# Patient Record
Sex: Female | Born: 1950 | Race: White | Hispanic: No | Marital: Single | Smoking: Former smoker
Health system: Southern US, Community
[De-identification: ages and names within clinical notes are randomized; demographics above are authoritative.]

## PROBLEM LIST (undated history)

## (undated) DIAGNOSIS — E785 Hyperlipidemia, unspecified: Secondary | ICD-10-CM

## (undated) DIAGNOSIS — R112 Nausea with vomiting, unspecified: Secondary | ICD-10-CM

## (undated) DIAGNOSIS — H269 Unspecified cataract: Secondary | ICD-10-CM

## (undated) DIAGNOSIS — K219 Gastro-esophageal reflux disease without esophagitis: Secondary | ICD-10-CM

## (undated) DIAGNOSIS — I38 Endocarditis, valve unspecified: Secondary | ICD-10-CM

## (undated) DIAGNOSIS — C439 Malignant melanoma of skin, unspecified: Secondary | ICD-10-CM

## (undated) DIAGNOSIS — M1990 Unspecified osteoarthritis, unspecified site: Secondary | ICD-10-CM

## (undated) DIAGNOSIS — T4145XA Adverse effect of unspecified anesthetic, initial encounter: Secondary | ICD-10-CM

## (undated) DIAGNOSIS — T8859XA Other complications of anesthesia, initial encounter: Secondary | ICD-10-CM

## (undated) DIAGNOSIS — F99 Mental disorder, not otherwise specified: Secondary | ICD-10-CM

## (undated) DIAGNOSIS — E7800 Pure hypercholesterolemia, unspecified: Secondary | ICD-10-CM

## (undated) DIAGNOSIS — H409 Unspecified glaucoma: Secondary | ICD-10-CM

## (undated) DIAGNOSIS — Z9889 Other specified postprocedural states: Secondary | ICD-10-CM

## (undated) HISTORY — PX: TONSILLECTOMY: SUR1361

## (undated) HISTORY — PX: TUBAL LIGATION: SHX77

## (undated) HISTORY — DX: Malignant melanoma of skin, unspecified: C43.9

## (undated) HISTORY — DX: Pure hypercholesterolemia, unspecified: E78.00

## (undated) HISTORY — PX: BACK SURGERY: SHX140

## (undated) HISTORY — PX: CHOLECYSTECTOMY: SHX55

---

## 2006-03-14 ENCOUNTER — Ambulatory Visit (HOSPITAL_COMMUNITY): Admission: RE | Admit: 2006-03-14 | Discharge: 2006-03-15 | Payer: Self-pay | Admitting: Neurosurgery

## 2010-08-07 ENCOUNTER — Encounter: Payer: Self-pay | Admitting: Obstetrics and Gynecology

## 2012-09-09 ENCOUNTER — Other Ambulatory Visit: Payer: Self-pay | Admitting: Ophthalmology

## 2012-09-09 MED ORDER — TETRACAINE HCL 0.5 % OP SOLN: 1.0000 [drp] | OPHTHALMIC | Status: DC

## 2012-09-09 NOTE — H&P (Signed)
                  History & Physical:   DATE:     NAME:  Stoke, Vannesa      [id number]       HISTORY OF PRESENT ILLNESS: Chief Eye Complaints glaucoma  No new complaints today (Ou burn after using gtts)      HPI: EYES: Reports symptoms of     LOCATION:      QUALITY/COURSE:   Reports condition is   INTENSITY/SEVERITY:    Reports measurement ( or degree) as   DURATION:   Reports the general length of symptoms to be   ONSET/TIMING:   Reports occurrence as   CONTEXT/WHEN:   Reports usually associated with   MODIFIERS/TREATMENTS:  Improved by              ROS:   GEN- Constitutional: HENT: GEN - Endocrine: Reports symptoms of LUNGS/Respiratory:  HEART/Cardiovascular: Reports symptoms of ABD/Gastrointestinal:   Musculoskeletal (BJE): NEURO/Neurological: PSYCH/Psychiatric:    Is the pt oriented to time, place, person? 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 We discussed surgery as a potential option if the maximum medications and laser treatment do not continue to control the patient's intraocular pressure.  We discussed the advanced nature of her glaucoma right eye and the need for a lower intraocular pressure.  Since we have not been able to keep the pressure low with medications and with laser treatment recommend glaucoma surgery right eye .   Nuclear sclerosis   ICD#366.16  SURGERIES: SLT Nasal  OD SEC  SLT TEMPORAL 05-02-12 OD     Pick List - Surgeries    Back Surgery  MEDICATIONS: Combigan: Strength-  SIG-   OU BID 8:00am  Latanoprost Ophthalmic: Strength-  SIG-  Dose-   Freq-    Ou qhs  Trusopt: 2% (solution) SIG-  1 gtt in each eye 3 times a day for 30 days  at lunch today noon  REVIEW OF SYSTEMS: not found  TOBACCO: No exposure to tobacco.      Never smoker   ICD#V13.89  Tobacco use:     Tobacco cessation:  SOCIAL HISTORY: Starter Pick List - Social History   Unemployed(laid Off) Single  FAMILY HISTORY: Positive family history for  -   Negative family history for  -   PARENTS: CHILDREN: GRANDPARENTS: SIBLINGS: UNCLES/AUNTS: OTHERS/DISTANT:  ALLERGIES: Drug Allergies.  No Known.   Drug Allergies:     No known.    Starter - Allergies - Summary:  PHYSICAL EXAMINATION: Va: 08/06/2012 10:29  older pair of glasses     OD:cc 20/25-3 PH 20/NI OS:cc 20/25 PH 20/NI  EYEGLASSES: 03-27-2012 new glasses rx @[facility] in [place] (may need adjusting) OD: +0.25 -2.25 x 090                                              OS:+1.50 -1.50 x 080 ADD:+2.50  MR   OD:   OS: ADD:  VF:   OD:                                                OS:  PUPILS: 4mm normal  EYELIDS & OCULAR ADNEXA: normal  SLE: Conjunctiva:quiet  Cornea: arcus OU  AC:  deep and quiet  OU  Iris: gray/green  Lens:  +2  nuclear  sclerosis  OU  Vitreous:  CCT:  IOP: By Dr. Yen Le 18 mmHG/14 mmHg @1:36 pm 03-27-2012  Ta   in mmHg:Target<18 OD (last visit OD 21/OS20) OD:  21 OS:19 Time: 09/03/2012 17:15  Gonio:   Dilation:  Fundus:  optic nerve: OD: inferior notch to rim pale  OS: temp pale 75% cup                  Macula:       OD:   normal                        OS: normal  Vessels: normal  Periphery:  Ocular.  Tomography right eye reveals a superior temporal and inferior and very thin nerve fiber layer, left eye reveals an inferior nerve fiber layer otherwise intact.  Blood pressure 130/88   pulse 76 Exam: GENERAL: Appearance: General appearance can be described as well-nourished, well-developed, and in no acute distress.    LYMPHATIC: HEAD,  EARS, NOSE AND THROAT: Ears-Nose (external) Inspection: Externally, nose and ears are normal in appearance and without scars, lesions, or nodules.      Otoscopic Exam: External auditory canals and tympanic membranes are normal.      Hearing assessment shows no problems with normal conversation.    Nose exam, internally, reveals nasal mucosa, septum and turbinates are unremarkable.    Teeth, Gingiva, and Lip Exams: No lesions or evidence of infection.      Oropharynx demonstrates oral mucosa, salivary glands, tongue, tonsils, posterior pharynx, hard-soft palates are normal.  EYES: see above  NECK: Neck tissue exam demonstrates no masses, symmetrical, and trachea is midline.      LUNGS and RESPIRATORY: Lung auscultation elicits no wheezing, rhonci, rales or rubs and with equal breath sounds.    Respiratory effort described as breathing is unlabored and chest movement is symmetrical.    HEART (Cardiovascular): Heart auscultation discovers regular rate and rhythm; no murmur, gallop or rub. Normal heart sounds.    ABDOMEN (Gastrointestinal): Mass/Tenderness Exam: Neither are present.     Liver/Spleen: No hepatomegaly or splenomegaly.   MUSCULOSKELETAL (BJE): Inspection-Palpation: No major bone, joint, tendon, or muscle changes.      NEUROLOGICAL: Alert and oriented. No major deficits of coordination or sensation.      PSYCHIATRIC: Insight and judgment appear  both to be intact and appropriate.    Mood and affect are described as normal mood and full affect.    SKIN: Skin Inspection: No rashes or lesions.  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 We  discussed surgery as a potential option   if the maximum medications and laser treatment do not continue to control the patient's intraocular pressure.  We discussed the advanced nature of her glaucoma right eye and the need for a lower intraocular pressure.  Since we have not been able to keep the pressure low with medications and with laser treatment recommend glaucoma surgery right eye .   Nuclear sclerosis   ICD#366.16  SURGICAL TREATMENT PLAN: Ocular.  Tomography now Glaucoma surgery trabeculectomy / glaucoma device with mitomycin-C right eye  Risk and benefits of surgery have been reviewed with the patient and the patient agrees to proceed with the surgical procedure.         Actions:    CPT Codes: 0 6183 glaucoma device    ___________________________ Tinisha Etzkorn, [name] Starter - Inactive Problems:    High Cholesterol  

## 2012-09-10 ENCOUNTER — Encounter (HOSPITAL_COMMUNITY): Payer: Self-pay | Admitting: *Deleted

## 2012-09-11 ENCOUNTER — Encounter (HOSPITAL_COMMUNITY)
Admission: RE | Disposition: A | Discharge status: Home / Self Care | Payer: Self-pay | Source: Ambulatory Visit | Attending: Ophthalmology

## 2012-09-11 ENCOUNTER — Ambulatory Visit (HOSPITAL_COMMUNITY)
Admission: RE | Admit: 2012-09-11 | Discharge: 2012-09-11 | Disposition: A | Discharge status: Home / Self Care | Payer: BC Managed Care – PPO | Source: Ambulatory Visit | Attending: Ophthalmology | Admitting: Ophthalmology

## 2012-09-11 ENCOUNTER — Encounter (HOSPITAL_COMMUNITY): Payer: Self-pay | Admitting: *Deleted

## 2012-09-11 ENCOUNTER — Ambulatory Visit (HOSPITAL_COMMUNITY): Payer: BC Managed Care – PPO | Admitting: *Deleted

## 2012-09-11 ENCOUNTER — Encounter (HOSPITAL_COMMUNITY): Payer: Self-pay | Admitting: Pharmacy Technician

## 2012-09-11 DIAGNOSIS — K219 Gastro-esophageal reflux disease without esophagitis: Secondary | ICD-10-CM | POA: Insufficient documentation

## 2012-09-11 DIAGNOSIS — H401490 Capsular glaucoma with pseudoexfoliation of lens, unspecified eye, stage unspecified: Secondary | ICD-10-CM | POA: Insufficient documentation

## 2012-09-11 DIAGNOSIS — H53419 Scotoma involving central area, unspecified eye: Secondary | ICD-10-CM | POA: Insufficient documentation

## 2012-09-11 DIAGNOSIS — H409 Unspecified glaucoma: Secondary | ICD-10-CM | POA: Insufficient documentation

## 2012-09-11 HISTORY — DX: Other complications of anesthesia, initial encounter: T88.59XA

## 2012-09-11 HISTORY — DX: Nausea with vomiting, unspecified: R11.2

## 2012-09-11 HISTORY — DX: Mental disorder, not otherwise specified: F99

## 2012-09-11 HISTORY — DX: Adverse effect of unspecified anesthetic, initial encounter: T41.45XA

## 2012-09-11 HISTORY — DX: Other specified postprocedural states: Z98.890

## 2012-09-11 HISTORY — DX: Hyperlipidemia, unspecified: E78.5

## 2012-09-11 HISTORY — PX: TRABECULECTOMY: SHX107

## 2012-09-11 HISTORY — DX: Unspecified glaucoma: H40.9

## 2012-09-11 HISTORY — DX: Gastro-esophageal reflux disease without esophagitis: K21.9

## 2012-09-11 HISTORY — DX: Unspecified osteoarthritis, unspecified site: M19.90

## 2012-09-11 LAB — CBC
HCT: 42.3 % (ref 36.0–46.0)
Hemoglobin: 14.6 g/dL (ref 12.0–15.0)
MCHC: 34.5 g/dL (ref 30.0–36.0)
MCV: 85.3 fL (ref 78.0–100.0)

## 2012-09-11 LAB — SURGICAL PCR SCREEN: Staphylococcus aureus: NEGATIVE

## 2012-09-11 SURGERY — TRABECULECTOMY
Anesthesia: Monitor Anesthesia Care | Site: Eye | Laterality: Right | Wound class: Clean

## 2012-09-11 SURGERY — TRABECULECTOMY
Anesthesia: Monitor Anesthesia Care | Laterality: Right

## 2012-09-11 MED ORDER — BUPIVACAINE HCL (PF) 0.75 % IJ SOLN
INTRAMUSCULAR | Status: AC
  Filled 2012-09-11: qty 10

## 2012-09-11 MED ORDER — BUPIVACAINE HCL (PF) 0.75 % IJ SOLN
INTRAMUSCULAR | Status: DC | PRN
  Administered 2012-09-11: 10 mL

## 2012-09-11 MED ORDER — LACTATED RINGERS IV SOLN
INTRAVENOUS | Status: DC | PRN
  Administered 2012-09-11: 11:00:00 via INTRAVENOUS

## 2012-09-11 MED ORDER — GATIFLOXACIN 0.5 % OP SOLN
1.0000 [drp] | OPHTHALMIC | Status: AC
  Administered 2012-09-11 (×2): 1 [drp] via OPHTHALMIC

## 2012-09-11 MED ORDER — TETRACAINE HCL 0.5 % OP SOLN
OPHTHALMIC | Status: DC | PRN
  Administered 2012-09-11: 2 [drp] via OPHTHALMIC

## 2012-09-11 MED ORDER — HYALURONIDASE HUMAN 150 UNIT/ML IJ SOLN
INTRAMUSCULAR | Status: DC | PRN
  Administered 2012-09-11: 150 [IU]

## 2012-09-11 MED ORDER — TRIAMCINOLONE ACETONIDE 40 MG/ML IJ SUSP
INTRAMUSCULAR | Status: AC
  Filled 2012-09-11: qty 5

## 2012-09-11 MED ORDER — BSS IO SOLN
INTRAOCULAR | Status: DC | PRN
  Administered 2012-09-11: 60 mL via INTRAOCULAR

## 2012-09-11 MED ORDER — TRIAMCINOLONE ACETONIDE 40 MG/ML IJ SUSP
INTRAMUSCULAR | Status: DC | PRN
  Administered 2012-09-11: .2 mL

## 2012-09-11 MED ORDER — MITOMYCIN-C INJECTION USE IN OR ONLY (0.4 MG/ML)
0.5000 mL | Freq: Once | INTRAVENOUS | Status: DC
  Filled 2012-09-11: qty 0.5

## 2012-09-11 MED ORDER — LIDOCAINE HCL 2 % IJ SOLN
INTRAMUSCULAR | Status: AC
  Filled 2012-09-11: qty 20

## 2012-09-11 MED ORDER — SODIUM HYALURONATE 10 MG/ML IO SOLN
INTRAOCULAR | Status: AC
  Filled 2012-09-11: qty 0.85

## 2012-09-11 MED ORDER — BSS IO SOLN
INTRAOCULAR | Status: AC
  Filled 2012-09-11: qty 500

## 2012-09-11 MED ORDER — FENTANYL CITRATE 0.05 MG/ML IJ SOLN
INTRAMUSCULAR | Status: DC | PRN
  Administered 2012-09-11: 25 ug via INTRAVENOUS

## 2012-09-11 MED ORDER — LIDOCAINE-EPINEPHRINE 2 %-1:100000 IJ SOLN
INTRAMUSCULAR | Status: AC
  Filled 2012-09-11: qty 1

## 2012-09-11 MED ORDER — PROVISC 10 MG/ML IO SOLN
INTRAOCULAR | Status: DC | PRN
  Administered 2012-09-11: 8.5 mg via INTRAOCULAR

## 2012-09-11 MED ORDER — ACETYLCHOLINE CHLORIDE 1:100 IO SOLR
INTRAOCULAR | Status: AC
  Filled 2012-09-11: qty 1

## 2012-09-11 MED ORDER — GATIFLOXACIN 0.5 % OP SOLN
OPHTHALMIC | Status: AC
  Administered 2012-09-11: 1 [drp] via OPHTHALMIC
  Filled 2012-09-11: qty 2.5

## 2012-09-11 MED ORDER — BSS IO SOLN
INTRAOCULAR | Status: AC
  Filled 2012-09-11: qty 15

## 2012-09-11 MED ORDER — PILOCARPINE HCL 4 % OP SOLN
OPHTHALMIC | Status: AC
  Filled 2012-09-11: qty 15

## 2012-09-11 MED ORDER — TOBRAMYCIN 0.3 % OP OINT
TOPICAL_OINTMENT | OPHTHALMIC | Status: DC | PRN
  Administered 2012-09-11: 1 via OPHTHALMIC

## 2012-09-11 MED ORDER — HYALURONIDASE HUMAN 150 UNIT/ML IJ SOLN
INTRAMUSCULAR | Status: AC
  Filled 2012-09-11: qty 1

## 2012-09-11 MED ORDER — FLUORESCEIN SODIUM 1 MG OP STRP
ORAL_STRIP | OPHTHALMIC | Status: AC
  Filled 2012-09-11: qty 2

## 2012-09-11 MED ORDER — FLUORESCEIN SODIUM 1 MG OP STRP
ORAL_STRIP | OPHTHALMIC | Status: DC | PRN
  Administered 2012-09-11: 1 via OPHTHALMIC

## 2012-09-11 MED ORDER — TETRACAINE HCL 0.5 % OP SOLN
OPHTHALMIC | Status: AC
  Filled 2012-09-11: qty 2

## 2012-09-11 MED ORDER — MITOMYCIN-C INJECTION USE IN OR ONLY (0.4 MG/ML)
INTRAVENOUS | Status: DC | PRN
  Administered 2012-09-11: 0.5 mL via OPHTHALMIC

## 2012-09-11 MED ORDER — LACTATED RINGERS IV SOLN
INTRAVENOUS | Status: DC
  Administered 2012-09-11: 10:00:00 via INTRAVENOUS

## 2012-09-11 MED ORDER — BSS IO SOLN
INTRAOCULAR | Status: DC | PRN
  Administered 2012-09-11: 15 mL via INTRAOCULAR

## 2012-09-11 MED ORDER — PROPOFOL 10 MG/ML IV BOLUS
INTRAVENOUS | Status: DC | PRN
  Administered 2012-09-11: 10 mg via INTRAVENOUS
  Administered 2012-09-11: 20 mg via INTRAVENOUS
  Administered 2012-09-11 (×4): 10 mg via INTRAVENOUS
  Administered 2012-09-11: 40 mg via INTRAVENOUS

## 2012-09-11 MED ORDER — LIDOCAINE HCL (CARDIAC) 20 MG/ML IV SOLN
INTRAVENOUS | Status: DC | PRN
  Administered 2012-09-11 (×2): 30 mg via INTRAVENOUS

## 2012-09-11 MED ORDER — ATROPINE SULFATE 1 % OP OINT
TOPICAL_OINTMENT | OPHTHALMIC | Status: AC
  Filled 2012-09-11: qty 3.5

## 2012-09-11 MED ORDER — LIDOCAINE-EPINEPHRINE 2 %-1:100000 IJ SOLN
INTRAMUSCULAR | Status: DC | PRN
  Administered 2012-09-11: 10 mL

## 2012-09-11 MED ORDER — BUPIVACAINE HCL (PF) 0.25 % IJ SOLN
INTRAMUSCULAR | Status: AC
  Filled 2012-09-11: qty 10

## 2012-09-11 MED ORDER — HEMOSTATIC AGENTS (NO CHARGE) OPTIME
TOPICAL | Status: DC | PRN
  Administered 2012-09-11: 1 via TOPICAL

## 2012-09-11 MED ORDER — TOBRAMYCIN-DEXAMETHASONE 0.3-0.1 % OP OINT
TOPICAL_OINTMENT | OPHTHALMIC | Status: AC
  Filled 2012-09-11: qty 3.5

## 2012-09-11 MED ORDER — MUPIROCIN 2 % EX OINT
TOPICAL_OINTMENT | Freq: Two times a day (BID) | CUTANEOUS | Status: DC
  Administered 2012-09-11: 1 via NASAL
  Filled 2012-09-11 (×2): qty 22

## 2012-09-11 MED ORDER — EPINEPHRINE HCL 1 MG/ML IJ SOLN
INTRAMUSCULAR | Status: AC
  Filled 2012-09-11: qty 1

## 2012-09-11 SURGICAL SUPPLY — 40 items
APPLICATOR COTTON TIP 6IN STRL (MISCELLANEOUS) ×2 IMPLANT
APPLICATOR DR MATTHEWS STRL (MISCELLANEOUS) ×2 IMPLANT
BLADE EYE CATARACT 19 1.4 BEAV (BLADE) ×2 IMPLANT
BLADE MINI RND TIP GREEN BEAV (BLADE) IMPLANT
BLADE STAB KNIFE 45DEG (BLADE) ×2 IMPLANT
CANISTER SUCTION 2500CC (MISCELLANEOUS) ×2 IMPLANT
CLOTH BEACON ORANGE TIMEOUT ST (SAFETY) ×2 IMPLANT
CORDS BIPOLAR (ELECTRODE) ×2 IMPLANT
DRAPE OPHTHALMIC 40X48 W POUCH (DRAPES) ×2 IMPLANT
DRAPE RETRACTOR (MISCELLANEOUS) ×2 IMPLANT
ERASER HMR WETFIELD 23G BP (MISCELLANEOUS) ×2 IMPLANT
GLOVE BIO SURGEON STRL SZ8 (GLOVE) ×2 IMPLANT
GLOVE BIO SURGEON STRL SZ8.5 (GLOVE) ×2 IMPLANT
GLOVE ECLIPSE 7.0 STRL STRAW (GLOVE) ×2 IMPLANT
GOWN STRL NON-REIN LRG LVL3 (GOWN DISPOSABLE) ×4 IMPLANT
KIT BASIN OR (CUSTOM PROCEDURE TRAY) ×2 IMPLANT
KIT ROOM TURNOVER OR (KITS) ×2 IMPLANT
KNIFE GRIESHABER SHARP 2.5MM (MISCELLANEOUS) ×2 IMPLANT
MARKER SKIN DUAL TIP RULER LAB (MISCELLANEOUS) ×2 IMPLANT
MASK EYE SHIELD (GAUZE/BANDAGES/DRESSINGS) ×2 IMPLANT
NEEDLE 25GX 5/8IN NON SAFETY (NEEDLE) ×2 IMPLANT
NEEDLE HYPO 30X.5 LL (NEEDLE) ×2 IMPLANT
NS IRRIG 1000ML POUR BTL (IV SOLUTION) ×2 IMPLANT
PACK CATARACT CUSTOM (CUSTOM PROCEDURE TRAY) ×2 IMPLANT
PAD ARMBOARD 7.5X6 YLW CONV (MISCELLANEOUS) ×4 IMPLANT
PAD EYE OVAL STERILE LF (GAUZE/BANDAGES/DRESSINGS) ×2 IMPLANT
SHUNT EXPRESS GLAUCOMA MINI (Shunt) ×2 IMPLANT
SPEAR EYE SURG WECK-CEL (MISCELLANEOUS) IMPLANT
SPECIMEN JAR SMALL (MISCELLANEOUS) IMPLANT
SPONGE SURGIFOAM ABS GEL 12-7 (HEMOSTASIS) ×2 IMPLANT
SUT ETHILON 10 0 CS140 6 (SUTURE) ×2 IMPLANT
SUT ETHILON 9 0 BV100 4 (SUTURE) IMPLANT
SUT SILK 6 0 G 6 (SUTURE) ×2 IMPLANT
SUT VICRYL 9-0 (SUTURE) ×2 IMPLANT
SYR 50ML SLIP (SYRINGE) ×2 IMPLANT
SYR TB 1ML LUER SLIP (SYRINGE) IMPLANT
TOWEL OR 17X24 6PK STRL BLUE (TOWEL DISPOSABLE) ×4 IMPLANT
TUBE CONNECTING 12X1/4 (SUCTIONS) ×2 IMPLANT
WATER STERILE IRR 1000ML POUR (IV SOLUTION) ×2 IMPLANT
WIPE INSTRUMENT VISIWIPE 73X73 (MISCELLANEOUS) ×2 IMPLANT

## 2012-09-11 NOTE — Anesthesia Postprocedure Evaluation (Signed)
  Anesthesia Post-op Note  Patient: Angelica Rhodes  Procedure(s) Performed: Procedure(s): TRABECULECTOMY WITH MITOMYCIN RIGHT EYE WITH GLAUCOMA DEVICE (Right) MITOMYCIN C APPLICATION (Right)  Patient Location: Short Stay  Anesthesia Type:MAC  Level of Consciousness: awake, oriented, sedated and patient cooperative  Airway and Oxygen Therapy: Patient Spontanous Breathing  Post-op Pain: none  Post-op Assessment: Post-op Vital signs reviewed, Patient's Cardiovascular Status Stable, Respiratory Function Stable, Patent Airway, No signs of Nausea or vomiting and Pain level controlled  Post-op Vital Signs: stable  Complications: No apparent anesthesia complications

## 2012-09-11 NOTE — Interval H&P Note (Signed)
History and Physical Interval Note:  09/11/2012 10:27 AM  Angelica Rhodes  has presented today for surgery, with the diagnosis of GLAUCOMA UNCONTROLLED  The various methods of treatment have been discussed with the patient and family. After consideration of risks, benefits and other options for treatment, the patient has consented to  Procedure(s): TRABECULECTOMY WITH MITOMYCIN RIGHT EYE WITH GLAUCOMA DEVICE (Right) MITOMYCIN C APPLICATION (Right) as a surgical intervention .  The patient's history has been reviewed, patient examined, no change in status, stable for surgery.  I have reviewed the patient's chart and labs.  Questions were answered to the patient's satisfaction.     Olanna Percifield

## 2012-09-11 NOTE — Preoperative (Signed)
Beta Blockers   Reason not to administer Beta Blockers:Not Applicable 

## 2012-09-11 NOTE — Transfer of Care (Signed)
Immediate Anesthesia Transfer of Care Note  Patient: Angelica Rhodes  Procedure(s) Performed: Procedure(s): TRABECULECTOMY WITH MITOMYCIN RIGHT EYE WITH GLAUCOMA DEVICE (Right) MITOMYCIN C APPLICATION (Right)  Patient Location: PACU and Short Stay  Anesthesia Type:MAC  Level of Consciousness: awake, alert , oriented and patient cooperative  Airway & Oxygen Therapy: Patient Spontanous Breathing  Post-op Assessment: Report given to PACU RN, Post -op Vital signs reviewed and stable and Patient moving all extremities X 4  Post vital signs: Reviewed and stable  Complications: No apparent anesthesia complications

## 2012-09-11 NOTE — H&P (View-Only) (Signed)
History & Physical:   DATE:     NAME:  Angelica Rhodes, Angelica Rhodes      [id number]       HISTORY OF PRESENT ILLNESS: Chief Eye Complaints glaucoma  No new complaints today (Ou burn after using gtts)      HPI: EYES: Reports symptoms of     LOCATION:      QUALITY/COURSE:   Reports condition is   INTENSITY/SEVERITY:    Reports measurement ( or degree) as   DURATION:   Reports the general length of symptoms to be   ONSET/TIMING:   Reports occurrence as   CONTEXT/WHEN:   Reports usually associated with   MODIFIERS/TREATMENTS:  Improved by              ROS:   GEN- Constitutional: HENT: GEN - Endocrine: Reports symptoms of LUNGS/Respiratory:  HEART/Cardiovascular: Reports symptoms of ABD/Gastrointestinal:   Musculoskeletal (BJE): NEURO/Neurological: PSYCH/Psychiatric:    Is the pt oriented to time, place, person? 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 We discussed surgery as a potential option if the maximum medications and laser treatment do not continue to control the patient's intraocular pressure.  We discussed the advanced nature of her glaucoma right eye and the need for a lower intraocular pressure.  Since we have not been able to keep the pressure low with medications and with laser treatment recommend glaucoma surgery right eye .   Nuclear sclerosis   ICD#366.16  SURGERIES: SLT Nasal  OD SEC  SLT TEMPORAL 05-02-12 OD     Pick List - Surgeries    Back Surgery  MEDICATIONS: Combigan: Strength-  SIG-   OU BID 8:00am  Latanoprost Ophthalmic: Strength-  SIG-  Dose-   Freq-    Ou qhs  Trusopt: 2% (solution) SIG-  1 gtt in each eye 3 times a day for 30 days  at lunch today noon  REVIEW OF SYSTEMS: not found  TOBACCO: No exposure to tobacco.      Never smoker   ICD#V13.89  Tobacco use:     Tobacco cessation:  SOCIAL HISTORY: Herbalist List - Social History   Unemployed(laid Off) Single  FAMILY HISTORY: Positive family history for  -   Negative family history for  -   PARENTS: CHILDREN: GRANDPARENTS: SIBLINGS: UNCLES/AUNTS: OTHERS/DISTANT:  ALLERGIES: Drug Allergies.  No Known.   Drug Allergies:     No known.    Starter - Allergies - Summary:  PHYSICAL EXAMINATION: Va: 08/06/2012 10:29  older pair of glasses     OD:cc 20/25-3 PH 20/NI OS:cc 20/25 PH 20/NI  EYEGLASSES: 03-27-2012 new glasses rx @[facility]  in [place] (may need adjusting) OD: +0.25 -2.25 x 090                                              OS:+1.50 -1.50 x 080 ADD:+2.50  MR   OD:  OS: ADD:  VF:   OD:                                                OS:  PUPILS: 4mm normal  EYELIDS & OCULAR ADNEXA: normal  SLE: Conjunctiva:quiet  Cornea: arcus OU  AC:  deep and quiet  OU  Iris: gray/green  Lens:  +2  nuclear  sclerosis  OU  Vitreous:  CCT:  IOP: By Dr. Despina Arias 18 mmHG/14 mmHg @1 :36 pm 03-27-2012  Ta   in mmHg:Target<18 OD (last visit OD 21/OS20) OD:  21 OS:19 Time: 09/03/2012 17:15  Gonio:   Dilation:  Fundus:  optic nerve: OD: inferior notch to rim pale  OS: temp pale 75% cup                  Macula:       OD:   normal                        OS: normal  Vessels: normal  Periphery:  Ocular.  Tomography right eye reveals a superior temporal and inferior and very thin nerve fiber layer, left eye reveals an inferior nerve fiber layer otherwise intact.  Blood pressure 130/88   pulse 76 Exam: GENERAL: Appearance: General appearance can be described as well-nourished, well-developed, and in no acute distress.    LYMPHATIC: HEAD,  EARS, NOSE AND THROAT: Ears-Nose (external) Inspection: Externally, nose and ears are normal in appearance and without scars, lesions, or nodules.      Otoscopic Exam: External auditory canals and tympanic membranes are normal.      Hearing assessment shows no problems with normal conversation.    Nose exam, internally, reveals nasal mucosa, septum and turbinates are unremarkable.    Teeth, Gingiva, and Lip Exams: No lesions or evidence of infection.      Oropharynx demonstrates oral mucosa, salivary glands, tongue, tonsils, posterior pharynx, hard-soft palates are normal.  EYES: see above  NECK: Neck tissue exam demonstrates no masses, symmetrical, and trachea is midline.      LUNGS and RESPIRATORY: Lung auscultation elicits no wheezing, rhonci, rales or rubs and with equal breath sounds.    Respiratory effort described as breathing is unlabored and chest movement is symmetrical.    HEART (Cardiovascular): Heart auscultation discovers regular rate and rhythm; no murmur, gallop or rub. Normal heart sounds.    ABDOMEN (Gastrointestinal): Mass/Tenderness Exam: Neither are present.     Liver/Spleen: No hepatomegaly or splenomegaly.   MUSCULOSKELETAL (BJE): Inspection-Palpation: No major bone, joint, tendon, or muscle changes.      NEUROLOGICAL: Alert and oriented. No major deficits of coordination or sensation.      PSYCHIATRIC: Insight and judgment appear  both to be intact and appropriate.    Mood and affect are described as normal mood and full affect.    SKIN: Skin Inspection: No rashes or lesions.  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 We  discussed surgery as a potential option  if the maximum medications and laser treatment do not continue to control the patient's intraocular pressure.  We discussed the advanced nature of her glaucoma right eye and the need for a lower intraocular pressure.  Since we have not been able to keep the pressure low with medications and with laser treatment recommend glaucoma surgery right eye .   Nuclear sclerosis   [id number]  SURGICAL TREATMENT PLAN: Ocular.  Tomography now Glaucoma surgery trabeculectomy / glaucoma device with mitomycin-C right eye  Risk and benefits of surgery have been reviewed with the patient and the patient agrees to proceed with the surgical procedure.         Actions:    CPT Codes: 0 6183 glaucoma device    ___________________________ Chalmers Guest, [name] Starter - Inactive Problems:    High Cholesterol

## 2012-09-11 NOTE — Op Note (Signed)
Preoperative diagnosis: Uncontrolled pseudoexfoliation glaucoma right eye Postoperative diagnosis: Same Procedure: Insertion of glaucoma device with mitomycin-C Anesthesia: 2% Xylocaine with epinephrine a 50-50 mixture of 0.75% Marcaine with ample [place] Assistant: Ann Procedure: The patient was taken to the operating room where she was given a peribulbar block with the aforementioned local anesthetic agent. Following this the patient's face prepped and draped in the usual sterile fashion. With the surgeon sitting superiorly and the operating microscope and positioned a Hoskins forceps was used to grasp the conjunctiva and an incision was made at the superior nasal limbus with a sharp Wescott scissors dissection was continued with the blunt Wescott scissors posteriorly the conjunctiva was recessed with a Weck-Cel sponge a Tooke blade was used to recess Tenons fibers bleeding was controlled with cautery. Following this a 45 blade was used to fashion a half thickness scleral flap with a basal 4 mm at the limbus. Decreased upper blade was used to dissect a scleral flap to the limbus following this mitomycin-C 0.4 mg per cc was placed on a Gelfoam sponge and allowed to stay under the conjunctiva and under the sclera for 2 minutes the sponge was removed and the eye was irrigated with 40 cc of balanced salt solution. Following this a paracentesis tract was formed at the 7:00 position and Provisc was injected into the anterior chamber. The scleral flap was then elevated and a 26-gauge needle attached to Provisc was passed under the scleral flap a rotated into position following this the express glaucoma filtration device was examined and noted to have no defects it was eversion P. 50 SN #[id number] the device was passed through the tract created by 26-gauge needle scleral flap was then sutured with 4 interrupted 10-0 nylon sutures. The anterior chamber remained deep throughout the case following this the conjunctiva  was then closed using a 9-0 Vicryl on a BV 100 needle. Reassess was injected in the anterior chamber to express the Provisc from the eye the bleb elevated and the incision was tested and was noted to be Seidel negative therefore subconjunctival injection of Kenalog 4 mg was given in the inferior subconjunctival space. Chalmers Guest Junior M.D.

## 2012-09-11 NOTE — Anesthesia Preprocedure Evaluation (Addendum)
Anesthesia Evaluation  Patient identified by MRN, date of birth, ID band Patient awake    Reviewed: Allergy & Precautions, H&P , NPO status , Patient's Chart, lab work & pertinent test results  History of Anesthesia Complications (+) PONV  Airway Mallampati: II TM Distance: >3 FB Neck ROM: Full    Dental  (+) Teeth Intact and Dental Advisory Given   Pulmonary neg pulmonary ROS,  breath sounds clear to auscultation        Cardiovascular negative cardio ROS  Rhythm:Regular Rate:Normal     Neuro/Psych negative neurological ROS     GI/Hepatic Neg liver ROS, GERD-  Medicated and Controlled,  Endo/Other  negative endocrine ROS  Renal/GU negative Renal ROS     Musculoskeletal   Abdominal Normal abdominal exam  (+)   Peds  Hematology negative hematology ROS (+)   Anesthesia Other Findings   Reproductive/Obstetrics                           Anesthesia Physical Anesthesia Plan  ASA: II  Anesthesia Plan: MAC   Post-op Pain Management:    Induction: Intravenous  Airway Management Planned: Mask  Additional Equipment:   Intra-op Plan:   Post-operative Plan:   Informed Consent: I have reviewed the patients History and Physical, chart, labs and discussed the procedure including the risks, benefits and alternatives for the proposed anesthesia with the patient or authorized representative who has indicated his/her understanding and acceptance.   Dental advisory given  Plan Discussed with: Anesthesiologist and Surgeon  Anesthesia Plan Comments:         Anesthesia Quick Evaluation

## 2012-09-12 ENCOUNTER — Encounter (HOSPITAL_COMMUNITY): Payer: Self-pay | Admitting: Ophthalmology

## 2013-11-20 ENCOUNTER — Encounter (HOSPITAL_COMMUNITY): Payer: Self-pay | Admitting: Pharmacy Technician

## 2013-11-24 ENCOUNTER — Other Ambulatory Visit: Payer: Self-pay | Admitting: Ophthalmology

## 2013-11-24 MED ORDER — TETRACAINE HCL 0.5 % OP SOLN: 1.0000 [drp] | OPHTHALMIC | Status: DC

## 2013-11-24 NOTE — H&P (Signed)
History & Physical:   DATE:   11-11-13  NAME:  Angelica Rhodes, Angelica Rhodes      [id number]       HISTORY OF PRESENT ILLNESS: Chief Eye Complaints   Glaucoma   patient  : Blurry vision causing  problems driving & during other daily activities, patient wants to have cataract s urgery OD   HPI: EYES: Reports symptoms of     LOCATION:   RIGHT EYE        QUALITY/COURSE:   Reports condition is worsening.        INTENSITY/SEVERITY:    Reports measurement ( or degree) as moderate.      DURATION:   Reports the general length of symptoms to be months.      ONSET/TIMING:   Reports occurrence as   CONTEXT/WHEN:   Reports usually associated with   MODIFIERS/TREATMENTS:  Improved by              ROS:   GEN- Constitutional: HENT: GEN - Endocrine: Reports symptoms of LUNGS/Respiratory:  HEART/Cardiovascular: Reports symptoms of irregular heart beats.    ABD/Gastrointestinal:   Musculoskeletal (BJE): NEURO/Neurological: PSYCH/Psychiatric:    Is the pt oriented to time, place, person? 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 Nuclear sclerosis   ICD#366.16  Onset:   Initial Date:   worse OD  SURGERIES: Trabec w/ mini tube w/[facility] OD 09/11/2012 SLT Nasal  OD SEC  SLT TEMPORAL 05-02-12 OD     Pick List - Surgeries    Back Surgery  MEDICATIONS: Combigan: 0.2%-0.5% solution SIG-  1 gtt in each affected eye every 12 hours for 30 days   Latanoprost Ophthalmic: Strength-  SIG-  Dose-  once a day (at bedtime)  OS  REVIEW OF SYSTEMS: not found  TOBACCO: No exposure to tobacco.      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  Father is dead.  ALLERGIES: Drug Allergies.  No Known.   TAPE, NON-WATERPROOF PER <=18 SQ IN.    #[id number]  Related Dxs-  Modifiers-        Starter - Allergies - Summary:  PHYSICAL EXAMINATION: VS: BMI: 27.3.  BP: 137/87.  H: 63.00 in.  P: 81 /min.  W: 154lbs 0oz.   07/29/2013 09:55  Va:  older pair of glasses     OD cc 20/CF PH 20/100 OS:cc 20/25+ PH 20/NI  EYEGLASSES: 03-27-2010 new glasses rx @[facility]  in [place] (may need adjusting) OD: plano + 1.50 x 178                                             OS:+ 0.75 + 1.00 x 004 ADD:+2.50  MR   MR:02/21/2013 10:14  OD:  -1.00 +05. x 180    20/200 OS:  + 0.50 + 1.00 x 004 ADD: +2.00  K's04/28/2015  11:21  OD:44.00,45.50 OS:44.75,45.25  VF:   OD:                                                OS:  PUPILS: 61mm normal  EYELIDS & OCULAR ADNEXA: trace right upper lid edema ptosis fissures 7 mm in primary gaze OD  Fissure 10 mm in primary gaze OS  Up gaze OD 83mm fissure OS 14 mm fissure  SLE: Conjunctiva:  nasal bleb OD - seidel 180 degrees   Cornea: arcus OU Debris in tear fillm  AC:  deep quie 8 , tube in good position OD   Iris: gray/green  Lens:  +3  nuclear  sclerosis  OD lens anterior white flecks     1-2   nuclear  sclerosis  OS Vitreous:  CCT:  IOP:   Ta    OD: 05 OS: 17 Time 11/11/2013 12:26  Gonio:   Dilation:  Fundus: good red reflex OD   optic nerve: OD: inferior notch to rim pale  OS: temp pale 75% cup                  Macula:       OD:   normal   flat                     OS: normal  Vessels: normal  Periphery: flat   Visual Fields  OD Paracentral Scotoma central scotoma visual fields stable. OS visual fields stable normal    Exam: GENERAL: Appearance: HEAD, EARS, NOSE AND THROAT: Ears-Nose (external) Inspection: Externally, nose and ears are  normal in appearance and without scars, lesions, or nodules.      Hearing assessment shows no problems with normal conversation.      LUNGS and RESPIRATORY: Lung auscultation elicits no wheezing, rhonci, rales or rubs and with equal breath sounds.    Respiratory effort described as breathing is unlabored and chest movement is symmetrical.    HEART (Cardiovascular): Heart auscultation discovers regular rate and rhythm; no murmur, gallop or rub. Normal heart sounds.    ABDOMEN (Gastrointestinal): Mass/Tenderness Exam: Neither are present.     MUSCULOSKELETAL (BJE): Inspection-Palpation: No major bone, joint, tendon, or muscle changes.      NEUROLOGICAL: Alert and oriented. No major deficits of coordination or sensation.      PSYCHIATRIC: Insight and judgment appear  both to be intact and appropriate.    Mood and affect are described as normal mood and full affect.    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 Nuclear sclerosis   ICD#366.16  Onset:   worse OD  SURGICAL TREATMENT PLAN: Start Durezol & ilevro 2 days before surgery  phaco emulsion cataract extraction  w  intraocular lens implant  OD    Risk and benefits of surgery have been reviewed with the patient and the patient agrees to proceed with the surgical procedure.  Robin called in prescription for ciloxan Actions:    Actions:     Handouts: glaucoma , what is glaucoma?, glaucoma treatment.    ___________________________ [name], [name] Starter - Inactive Problems:    High Cholesterol

## 2013-11-25 ENCOUNTER — Encounter (HOSPITAL_COMMUNITY): Payer: Self-pay | Admitting: *Deleted

## 2013-11-25 MED ORDER — CYCLOPENTOLATE HCL 1 % OP SOLN: 1.0000 [drp] | OPHTHALMIC | Status: DC

## 2013-11-25 MED ORDER — GATIFLOXACIN 0.5 % OP SOLN: 1.0000 [drp] | OPHTHALMIC | Status: DC | PRN

## 2013-11-25 MED ORDER — PHENYLEPHRINE HCL 2.5 % OP SOLN: 1.0000 [drp] | OPHTHALMIC | Status: DC

## 2013-11-25 MED ORDER — KETOROLAC TROMETHAMINE 0.5 % OP SOLN: 1.0000 [drp] | OPHTHALMIC | Status: DC

## 2013-11-25 MED ORDER — TROPICAMIDE 1 % OP SOLN: 1.0000 [drp] | OPHTHALMIC | Status: DC

## 2013-11-26 ENCOUNTER — Ambulatory Visit (HOSPITAL_COMMUNITY): Payer: Medicare Other | Admitting: Anesthesiology

## 2013-11-26 ENCOUNTER — Encounter (HOSPITAL_COMMUNITY): Payer: Medicare Other | Admitting: Anesthesiology

## 2013-11-26 ENCOUNTER — Encounter (HOSPITAL_COMMUNITY)
Admission: RE | Disposition: A | Discharge status: Home / Self Care | Payer: Self-pay | Source: Ambulatory Visit | Attending: Ophthalmology

## 2013-11-26 ENCOUNTER — Encounter (HOSPITAL_COMMUNITY): Payer: Self-pay | Admitting: *Deleted

## 2013-11-26 ENCOUNTER — Ambulatory Visit (HOSPITAL_COMMUNITY)
Admission: RE | Admit: 2013-11-26 | Discharge: 2013-11-26 | Disposition: A | Discharge status: Home / Self Care | Payer: Medicare Other | Source: Ambulatory Visit | Attending: Ophthalmology | Admitting: Ophthalmology

## 2013-11-26 DIAGNOSIS — Z87891 Personal history of nicotine dependence: Secondary | ICD-10-CM | POA: Insufficient documentation

## 2013-11-26 DIAGNOSIS — H409 Unspecified glaucoma: Secondary | ICD-10-CM | POA: Insufficient documentation

## 2013-11-26 DIAGNOSIS — K219 Gastro-esophageal reflux disease without esophagitis: Secondary | ICD-10-CM | POA: Insufficient documentation

## 2013-11-26 DIAGNOSIS — H401490 Capsular glaucoma with pseudoexfoliation of lens, unspecified eye, stage unspecified: Secondary | ICD-10-CM | POA: Insufficient documentation

## 2013-11-26 DIAGNOSIS — H2510 Age-related nuclear cataract, unspecified eye: Secondary | ICD-10-CM | POA: Insufficient documentation

## 2013-11-26 HISTORY — DX: Unspecified cataract: H26.9

## 2013-11-26 HISTORY — DX: Endocarditis, valve unspecified: I38

## 2013-11-26 HISTORY — PX: CATARACT EXTRACTION W/PHACO: SHX586

## 2013-11-26 LAB — BASIC METABOLIC PANEL
BUN: 17 mg/dL (ref 6–23)
CALCIUM: 9.6 mg/dL (ref 8.4–10.5)
CO2: 27 mEq/L (ref 19–32)
Chloride: 104 mEq/L (ref 96–112)
Creatinine, Ser: 0.68 mg/dL (ref 0.50–1.10)
GFR, EST NON AFRICAN AMERICAN: 90 mL/min — AB (ref >90)
Glucose, Bld: 108 mg/dL — ABNORMAL HIGH (ref 70–99)
POTASSIUM: 4.3 meq/L (ref 3.7–5.3)
Sodium: 143 mEq/L (ref 137–147)

## 2013-11-26 LAB — CBC
HEMATOCRIT: 39.4 % (ref 36.0–46.0)
Hemoglobin: 13.4 g/dL (ref 12.0–15.0)
MCH: 29.4 pg (ref 26.0–34.0)
MCHC: 34 g/dL (ref 30.0–36.0)
MCV: 86.4 fL (ref 78.0–100.0)
Platelets: 207 10*3/uL (ref 150–400)
RBC: 4.56 MIL/uL (ref 3.87–5.11)
RDW: 13.4 % (ref 11.5–15.5)
WBC: 5 10*3/uL (ref 4.0–10.5)

## 2013-11-26 SURGERY — PHACOEMULSIFICATION, CATARACT, WITH IOL INSERTION
Anesthesia: Monitor Anesthesia Care | Site: Eye | Laterality: Right

## 2013-11-26 MED ORDER — DEXAMETHASONE SODIUM PHOSPHATE 10 MG/ML IJ SOLN
INTRAMUSCULAR | Status: AC
  Filled 2013-11-26: qty 1

## 2013-11-26 MED ORDER — SODIUM CHLORIDE 0.9 % IV SOLN
INTRAVENOUS | Status: DC | PRN
  Administered 2013-11-26: 10:00:00 via INTRAVENOUS

## 2013-11-26 MED ORDER — PHENYLEPHRINE HCL 2.5 % OP SOLN
1.0000 [drp] | OPHTHALMIC | Status: AC
  Administered 2013-11-26 (×3): 1 [drp] via OPHTHALMIC
  Filled 2013-11-26: qty 15

## 2013-11-26 MED ORDER — TOBRAMYCIN-DEXAMETHASONE 0.3-0.1 % OP OINT
TOPICAL_OINTMENT | OPHTHALMIC | Status: AC
  Filled 2013-11-26: qty 3.5

## 2013-11-26 MED ORDER — LIDOCAINE HCL (CARDIAC) 20 MG/ML IV SOLN
INTRAVENOUS | Status: DC | PRN
  Administered 2013-11-26: 50 mg via INTRAVENOUS

## 2013-11-26 MED ORDER — ACETYLCHOLINE CHLORIDE 1:100 IO SOLR
INTRAOCULAR | Status: AC
  Filled 2013-11-26: qty 1

## 2013-11-26 MED ORDER — TROPICAMIDE 1 % OP SOLN
1.0000 [drp] | OPHTHALMIC | Status: AC
  Administered 2013-11-26 (×3): 1 [drp] via OPHTHALMIC
  Filled 2013-11-26: qty 3

## 2013-11-26 MED ORDER — CYCLOPENTOLATE HCL 1 % OP SOLN
1.0000 [drp] | OPHTHALMIC | Status: AC
  Administered 2013-11-26 (×3): 1 [drp] via OPHTHALMIC
  Filled 2013-11-26: qty 2

## 2013-11-26 MED ORDER — SODIUM CHLORIDE 0.9 % IV SOLN
INTRAVENOUS | Status: DC
  Administered 2013-11-26: 09:00:00 via INTRAVENOUS

## 2013-11-26 MED ORDER — EPINEPHRINE HCL 1 MG/ML IJ SOLN
INTRAMUSCULAR | Status: AC
  Filled 2013-11-26: qty 1

## 2013-11-26 MED ORDER — 0.9 % SODIUM CHLORIDE (POUR BTL) OPTIME
TOPICAL | Status: DC | PRN
  Administered 2013-11-26: 1000 mL

## 2013-11-26 MED ORDER — SODIUM HYALURONATE 10 MG/ML IO SOLN
INTRAOCULAR | Status: AC
  Filled 2013-11-26: qty 0.85

## 2013-11-26 MED ORDER — LIDOCAINE-EPINEPHRINE 2 %-1:100000 IJ SOLN
INTRAMUSCULAR | Status: DC | PRN
  Administered 2013-11-26: 11:00:00 via RETROBULBAR

## 2013-11-26 MED ORDER — FENTANYL CITRATE 0.05 MG/ML IJ SOLN
INTRAMUSCULAR | Status: DC | PRN
  Administered 2013-11-26: 50 ug via INTRAVENOUS

## 2013-11-26 MED ORDER — BSS IO SOLN
INTRAOCULAR | Status: DC | PRN
  Administered 2013-11-26: 10:00:00

## 2013-11-26 MED ORDER — LIDOCAINE-EPINEPHRINE 2 %-1:100000 IJ SOLN
INTRAMUSCULAR | Status: AC
  Filled 2013-11-26: qty 1

## 2013-11-26 MED ORDER — LIDOCAINE HCL 2 % IJ SOLN
INTRAMUSCULAR | Status: AC
  Filled 2013-11-26: qty 20

## 2013-11-26 MED ORDER — GENTAMICIN SULFATE 40 MG/ML IJ SOLN
INTRAMUSCULAR | Status: AC
  Filled 2013-11-26: qty 2

## 2013-11-26 MED ORDER — PILOCARPINE HCL 4 % OP SOLN
OPHTHALMIC | Status: AC
  Filled 2013-11-26: qty 15

## 2013-11-26 MED ORDER — BSS IO SOLN
INTRAOCULAR | Status: AC
  Filled 2013-11-26: qty 15

## 2013-11-26 MED ORDER — HYALURONIDASE HUMAN 150 UNIT/ML IJ SOLN
INTRAMUSCULAR | Status: AC
  Filled 2013-11-26: qty 1

## 2013-11-26 MED ORDER — KETOROLAC TROMETHAMINE 0.5 % OP SOLN
1.0000 [drp] | OPHTHALMIC | Status: AC
  Administered 2013-11-26 (×3): 1 [drp] via OPHTHALMIC
  Filled 2013-11-26: qty 5

## 2013-11-26 MED ORDER — PROPOFOL 10 MG/ML IV BOLUS
INTRAVENOUS | Status: AC
  Filled 2013-11-26: qty 20

## 2013-11-26 MED ORDER — MIDAZOLAM HCL 5 MG/5ML IJ SOLN
INTRAMUSCULAR | Status: DC | PRN
  Administered 2013-11-26 (×2): 1 mg via INTRAVENOUS

## 2013-11-26 MED ORDER — BUPIVACAINE HCL (PF) 0.75 % IJ SOLN
INTRAMUSCULAR | Status: AC
  Filled 2013-11-26: qty 10

## 2013-11-26 MED ORDER — TETRACAINE HCL 0.5 % OP SOLN
OPHTHALMIC | Status: DC | PRN
  Administered 2013-11-26: 1 [drp] via OPHTHALMIC

## 2013-11-26 MED ORDER — BSS IO SOLN
INTRAOCULAR | Status: AC
  Filled 2013-11-26: qty 500

## 2013-11-26 MED ORDER — ACETYLCHOLINE CHLORIDE 1:100 IO SOLR
INTRAOCULAR | Status: DC | PRN
  Administered 2013-11-26: 20 mg via INTRAOCULAR

## 2013-11-26 MED ORDER — TETRACAINE HCL 0.5 % OP SOLN
OPHTHALMIC | Status: AC
  Filled 2013-11-26: qty 2

## 2013-11-26 MED ORDER — SODIUM HYALURONATE 10 MG/ML IO SOLN
INTRAOCULAR | Status: DC | PRN
  Administered 2013-11-26: 0.85 mL via INTRAOCULAR

## 2013-11-26 MED ORDER — NA CHONDROIT SULF-NA HYALURON 40-30 MG/ML IO SOLN
INTRAOCULAR | Status: DC | PRN
  Administered 2013-11-26: 0.5 mL via INTRAOCULAR

## 2013-11-26 MED ORDER — FENTANYL CITRATE 0.05 MG/ML IJ SOLN
INTRAMUSCULAR | Status: AC
  Filled 2013-11-26: qty 5

## 2013-11-26 MED ORDER — MIDAZOLAM HCL 2 MG/2ML IJ SOLN
INTRAMUSCULAR | Status: AC
  Filled 2013-11-26: qty 2

## 2013-11-26 MED ORDER — NA CHONDROIT SULF-NA HYALURON 40-30 MG/ML IO SOLN
INTRAOCULAR | Status: AC
  Filled 2013-11-26: qty 0.5

## 2013-11-26 MED ORDER — PROPOFOL 10 MG/ML IV BOLUS
INTRAVENOUS | Status: DC | PRN
  Administered 2013-11-26: 30 mg via INTRAVENOUS

## 2013-11-26 MED ORDER — GATIFLOXACIN 0.5 % OP SOLN
1.0000 [drp] | OPHTHALMIC | Status: AC
  Administered 2013-11-26 (×3): 1 [drp] via OPHTHALMIC
  Filled 2013-11-26: qty 2.5

## 2013-11-26 MED ORDER — TOBRAMYCIN 0.3 % OP OINT
TOPICAL_OINTMENT | OPHTHALMIC | Status: DC | PRN
  Administered 2013-11-26: 1 via OPHTHALMIC

## 2013-11-26 SURGICAL SUPPLY — 45 items
APPLICATOR COTTON TIP 6IN STRL (MISCELLANEOUS) ×3 IMPLANT
APPLICATOR DR MATTHEWS STRL (MISCELLANEOUS) ×3 IMPLANT
BLADE 10 SAFETY STRL DISP (BLADE) ×3 IMPLANT
BLADE KERATOME 2.75 (BLADE) ×2 IMPLANT
BLADE KERATOME 2.75MM (BLADE) ×1
BLADE MINI RND TIP GREEN BEAV (BLADE) IMPLANT
BLADE STAB KNIFE 45DEG (BLADE) IMPLANT
CANNULA ANTERIOR CHAMBER 27GA (MISCELLANEOUS) ×3 IMPLANT
CORDS BIPOLAR (ELECTRODE) IMPLANT
COVER MAYO STAND STRL (DRAPES) ×3 IMPLANT
DRAPE OPHTHALMIC 40X48 W POUCH (DRAPES) ×3 IMPLANT
DRAPE RETRACTOR (MISCELLANEOUS) ×3 IMPLANT
FILTER BLUE MILLIPORE (MISCELLANEOUS) IMPLANT
GLOVE BIO SURGEON STRL SZ8 (GLOVE) ×3 IMPLANT
GLOVE SURG SS PI 7.0 STRL IVOR (GLOVE) ×3 IMPLANT
GLOVE SURG SS PI 8.0 STRL IVOR (GLOVE) ×3 IMPLANT
GOWN STRL REUS W/ TWL LRG LVL3 (GOWN DISPOSABLE) ×2 IMPLANT
GOWN STRL REUS W/TWL LRG LVL3 (GOWN DISPOSABLE) ×4
KIT BASIN OR (CUSTOM PROCEDURE TRAY) ×3 IMPLANT
KIT ROOM TURNOVER OR (KITS) ×3 IMPLANT
KNIFE CRESCENT 2.5 55 ANG (BLADE) IMPLANT
LENS IOL ACRSF IQ PC 22.0 (Intraocular Lens) ×1 IMPLANT
LENS IOL ACRYSOF IQ POST 22.0 (Intraocular Lens) ×3 IMPLANT
MASK EYE SHIELD (GAUZE/BANDAGES/DRESSINGS) ×3 IMPLANT
NEEDLE 18GX1X1/2 (RX/OR ONLY) (NEEDLE) ×3 IMPLANT
NEEDLE 25GX 5/8IN NON SAFETY (NEEDLE) ×3 IMPLANT
NEEDLE FILTER BLUNT 18X 1/2SAF (NEEDLE) ×2
NEEDLE FILTER BLUNT 18X1 1/2 (NEEDLE) ×1 IMPLANT
NS IRRIG 1000ML POUR BTL (IV SOLUTION) ×3 IMPLANT
PACK CATARACT CUSTOM (CUSTOM PROCEDURE TRAY) ×3 IMPLANT
PAD ARMBOARD 7.5X6 YLW CONV (MISCELLANEOUS) ×3 IMPLANT
PAK PIK CVS CATARACT (OPHTHALMIC) ×3 IMPLANT
PROBE ANTERIOR VITRECTOR (OPHTHALMIC) IMPLANT
SPEAR EYE SURG WECK-CEL (MISCELLANEOUS) IMPLANT
SUT ETHILON 10 0 CS140 6 (SUTURE) ×3 IMPLANT
SUT SILK 4 0 C 3 735G (SUTURE) IMPLANT
SUT SILK 6 0 G 6 (SUTURE) IMPLANT
SUT VICRYL 8 0 TG140 8 (SUTURE) IMPLANT
SYR 3ML LL SCALE MARK (SYRINGE) IMPLANT
SYR TB 1ML LUER SLIP (SYRINGE) ×3 IMPLANT
TIP ABS 45DEG FLARED 0.9MM (TIP) ×3 IMPLANT
TIP PHACO STRAIGHT 30DEG (OPHTHALMIC) ×3 IMPLANT
TOWEL OR 17X24 6PK STRL BLUE (TOWEL DISPOSABLE) ×6 IMPLANT
WATER STERILE IRR 1000ML POUR (IV SOLUTION) ×3 IMPLANT
WIPE INSTRUMENT VISIWIPE 73X73 (MISCELLANEOUS) ×3 IMPLANT

## 2013-11-26 NOTE — H&P (View-Only) (Signed)
History & Physical:   DATE:   11-11-13  NAME:  Angelica Rhodes, Angelica Rhodes      [id number]       HISTORY OF PRESENT ILLNESS: Chief Eye Complaints   Glaucoma   patient  : Blurry vision causing  problems driving & during other daily activities, patient wants to have cataract s urgery OD   HPI: EYES: Reports symptoms of     LOCATION:   RIGHT EYE        QUALITY/COURSE:   Reports condition is worsening.        INTENSITY/SEVERITY:    Reports measurement ( or degree) as moderate.      DURATION:   Reports the general length of symptoms to be months.      ONSET/TIMING:   Reports occurrence as   CONTEXT/WHEN:   Reports usually associated with   MODIFIERS/TREATMENTS:  Improved by              ROS:   GEN- Constitutional: HENT: GEN - Endocrine: Reports symptoms of LUNGS/Respiratory:  HEART/Cardiovascular: Reports symptoms of irregular heart beats.    ABD/Gastrointestinal:   Musculoskeletal (BJE): NEURO/Neurological: PSYCH/Psychiatric:    Is the pt oriented to time, place, person? 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 Nuclear sclerosis   ICD#366.16  Onset:   Initial Date:   worse OD  SURGERIES: Trabec w/ mini tube w/[facility] OD 09/11/2012 SLT Nasal  OD SEC  SLT TEMPORAL 05-02-12 OD     Pick List - Surgeries    Back Surgery  MEDICATIONS: Combigan: 0.2%-0.5% solution SIG-  1 gtt in each affected eye every 12 hours for 30 days   Latanoprost Ophthalmic: Strength-  SIG-  Dose-  once a day (at bedtime)  OS  REVIEW OF SYSTEMS: not found  TOBACCO: No exposure to tobacco.      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  Father is dead.  ALLERGIES: Drug Allergies.  No Known.   TAPE, NON-WATERPROOF PER <=18 SQ IN.    #[id number]  Related Dxs-  Modifiers-        Starter - Allergies - Summary:  PHYSICAL EXAMINATION: VS: BMI: 27.3.  BP: 137/87.  H: 63.00 in.  P: 81 /min.  W: 154lbs 0oz.   07/29/2013 09:55  Va:  older pair of glasses     OD cc 20/CF PH 20/100 OS:cc 20/25+ PH 20/NI  EYEGLASSES: 03-27-2010 new glasses rx @[facility]  in [place] (may need adjusting) OD: plano + 1.50 x 178                                             OS:+ 0.75 + 1.00 x 004 ADD:+2.50  MR   MR:02/21/2013 10:14  OD:  -1.00 +05. x 180    20/200 OS:  + 0.50 + 1.00 x 004 ADD: +2.00  K's04/28/2015  11:21  OD:44.00,45.50 OS:44.75,45.25  VF:   OD:                                                OS:  PUPILS: 61mm normal  EYELIDS & OCULAR ADNEXA: trace right upper lid edema ptosis fissures 7 mm in primary gaze OD  Fissure 10 mm in primary gaze OS  Up gaze OD 83mm fissure OS 14 mm fissure  SLE: Conjunctiva:  nasal bleb OD - seidel 180 degrees   Cornea: arcus OU Debris in tear fillm  AC:  deep quie 8 , tube in good position OD   Iris: gray/green  Lens:  +3  nuclear  sclerosis  OD lens anterior white flecks     1-2   nuclear  sclerosis  OS Vitreous:  CCT:  IOP:   Ta    OD: 05 OS: 17 Time 11/11/2013 12:26  Gonio:   Dilation:  Fundus: good red reflex OD   optic nerve: OD: inferior notch to rim pale  OS: temp pale 75% cup                  Macula:       OD:   normal   flat                     OS: normal  Vessels: normal  Periphery: flat   Visual Fields  OD Paracentral Scotoma central scotoma visual fields stable. OS visual fields stable normal    Exam: GENERAL: Appearance: HEAD, EARS, NOSE AND THROAT: Ears-Nose (external) Inspection: Externally, nose and ears are  normal in appearance and without scars, lesions, or nodules.      Hearing assessment shows no problems with normal conversation.      LUNGS and RESPIRATORY: Lung auscultation elicits no wheezing, rhonci, rales or rubs and with equal breath sounds.    Respiratory effort described as breathing is unlabored and chest movement is symmetrical.    HEART (Cardiovascular): Heart auscultation discovers regular rate and rhythm; no murmur, gallop or rub. Normal heart sounds.    ABDOMEN (Gastrointestinal): Mass/Tenderness Exam: Neither are present.     MUSCULOSKELETAL (BJE): Inspection-Palpation: No major bone, joint, tendon, or muscle changes.      NEUROLOGICAL: Alert and oriented. No major deficits of coordination or sensation.      PSYCHIATRIC: Insight and judgment appear  both to be intact and appropriate.    Mood and affect are described as normal mood and full affect.    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 Nuclear sclerosis   ICD#366.16  Onset:   worse OD  SURGICAL TREATMENT PLAN: Start Durezol & ilevro 2 days before surgery  phaco emulsion cataract extraction  w  intraocular lens implant  OD    Risk and benefits of surgery have been reviewed with the patient and the patient agrees to proceed with the surgical procedure.  Robin called in prescription for ciloxan Actions:    Actions:     Handouts: glaucoma , what is glaucoma?, glaucoma treatment.    ___________________________ [name], [name] Starter - Inactive Problems:    High Cholesterol

## 2013-11-26 NOTE — Discharge Instructions (Signed)
The patient may remove the eye patch today at 4:00 PM. The patient should instilled the eye drops given to the patient at the office as well as the antibiotic eyedrop prescription used preoperatively. The plastic eye shield should be placed over the eye at bedtime and during sleep. The patient should where eyeglasses or sunglasses during the daytime. Avoid rubbing the eye sleep on the left side or on the back. Patient may use pain medication if this does not relieve the pain the patient should call the office.

## 2013-11-26 NOTE — Anesthesia Preprocedure Evaluation (Addendum)
Anesthesia Evaluation  Patient identified by MRN, date of birth, ID band Patient awake    Reviewed: Allergy & Precautions, H&P , NPO status , Patient's Chart, lab work & pertinent test results  History of Anesthesia Complications Negative for: history of anesthetic complications  Airway Mallampati: II TM Distance: >3 FB Neck ROM: Full    Dental  (+) Teeth Intact, Dental Advisory Given   Pulmonary former smoker,  breath sounds clear to auscultation  Pulmonary exam normal       Cardiovascular - anginaRhythm:Regular Rate:Normal  4/15 stress: EF 74%, no ischemia or scar 4/15 ECHO: normal LVF, EF 60-65%, mild AI, trace MR   Neuro/Psych negative neurological ROS     GI/Hepatic Neg liver ROS, GERD-  Controlled,  Endo/Other  negative endocrine ROS  Renal/GU negative Renal ROS     Musculoskeletal   Abdominal   Peds  Hematology negative hematology ROS (+)   Anesthesia Other Findings   Reproductive/Obstetrics                          Anesthesia Physical Anesthesia Plan  ASA: II  Anesthesia Plan: MAC   Post-op Pain Management:    Induction: Intravenous  Airway Management Planned: Nasal Cannula  Additional Equipment:   Intra-op Plan:   Post-operative Plan:   Informed Consent: I have reviewed the patients History and Physical, chart, labs and discussed the procedure including the risks, benefits and alternatives for the proposed anesthesia with the patient or authorized representative who has indicated his/her understanding and acceptance.   Dental advisory given  Plan Discussed with: CRNA and Surgeon  Anesthesia Plan Comments: (Plan routine monitors, MAC)        Anesthesia Quick Evaluation

## 2013-11-26 NOTE — Interval H&P Note (Signed)
History and Physical Interval Note:  11/26/2013 10:09 AM  Angelica Rhodes  has presented today for surgery, with the diagnosis of Senile nuclear sclerosis [366.16]  The various methods of treatment have been discussed with the patient and family. After consideration of risks, benefits and other options for treatment, the patient has consented to  Procedure(s): PHACO EMULSION CATARACT EXTRACTION WITH INTROCULAR LENS IMPLANTS RIGHT EYE (Right) as a surgical intervention .  The patient's history has been reviewed, patient examined, no change in status, stable for surgery.  I have reviewed the patient's chart and labs.  Questions were answered to the patient's satisfaction.     [name]

## 2013-11-26 NOTE — Anesthesia Postprocedure Evaluation (Signed)
  Anesthesia Post-op Note  Patient: Angelica Rhodes  Procedure(s) Performed: Procedure(s): PHACO EMULSION CATARACT EXTRACTION WITH INTROCULAR LENS IMPLANTS RIGHT EYE (Right)  Patient Location: PACU  Anesthesia Type:MAC  Level of Consciousness: awake  Airway and Oxygen Therapy: Patient Spontanous Breathing  Post-op Pain: mild  Post-op Assessment: Post-op Vital signs reviewed  Post-op Vital Signs: Reviewed  Last Vitals:  Filed Vitals:   11/26/13 1206  BP: 125/78  Pulse:   Temp:   Resp:     Complications: No apparent anesthesia complications

## 2013-11-26 NOTE — Op Note (Signed)
Preoperative diagnosis: Visually significant cataract following previous glaucoma surgery right eye Postoperative diagnosis: Same Procedure: Phacoemulsified tissue intraocular lens implant Complications: None Anesthesia: 2% Xylocaine with epinephrine a 50-50 mixture 0.75% Marcaine with ample Wydase Procedure: The patient transferred to the operating room where she was given a peribulbar block with the aforementioned local anesthetic agent. Pressure was applied to the eye and the eye felt a bit firm therefore the Schiotz tonometer was used to measure the intraocular pressure in that there was and 60 chemosis the Schiotz reading was greater than 10 indicating a low intraocular pressure. Following this the patient's face was prepped and draped in the usual sterile fashion a lid speculum was inserted with the operating microscope position with a surgeon sitting temporally. A Weck-Cel sponge was used to fixate the globe it was noted that there was a superior nasal bleb which was avoided throughout the case. With the Weck-Cel sponge in place a 15 blade was used to enter through clear cornea at the 11:00 position and Provisc was injected to deepen the anterior chamber. Following this an additional Weck-Cel sponge was used to and a 2.75 mm keratome blade was used in a stepwise fashion through temporal clear cornea to into the eye. Following this a bent 25-gauge needle was used to incise anterior capsule and a continuous tear curvilinear capsulorrhexis was formed. The capsule forceps were used to remove the anterior capsule BSS was used to hydrodissect and hydrodelineate the nucleus the nucleus rotated in the capsular bag. Additional Provisc was injected in the eye and the phacoemulsification Kelman tip 45 Ozil tip was used to sculpt a central trough to the Kuglen hook was used to an attempt to snap the nucleus have the nucleus was too dense to snap therefore for troughs were formed with deep sculpting the phacotip  and the Kuglen hook were then used to separate each of the 4 quadrants the quadrants were then aspirated and phacoemulsified the eye. All in this the irrigation aspiration device was used to removed cortical material there was only a small amount of central cortical material with a. Cortical of tissue. The eye was very soft at this point therefore Provisc was injected in the eye the intraocular lens implant was examined and noted to have no defects the lens was an Alcon AcrySof SN 60 WF IQ lens 22 point diopter lens SN [id number] The lens had no defects therefore the lens is placed in the lens injector and injected and positioned in the capsular bag using a Kuglen hook. The irrigation aspiration device was then used to remove residual cortical material the lens was centered. Miochol was injected in the eye and the pupil was noted to come down round the eye was pressurized and a 10-0 nylon suture was placed to achieve watertight closure at the incision. All instruments removed from the eye and topical TobraDex ointment was applied to the eye a patch and Fox U. were placed and the patient returned to recovery area in stable condition [name] Junior M.D.

## 2013-11-26 NOTE — Transfer of Care (Signed)
Immediate Anesthesia Transfer of Care Note  Patient: Angelica Rhodes  Procedure(s) Performed: Procedure(s): PHACO EMULSION CATARACT EXTRACTION WITH INTROCULAR LENS IMPLANTS RIGHT EYE (Right)  Patient Location: PACU  Anesthesia Type:MAC  Level of Consciousness: awake, alert , oriented and sedated  Airway & Oxygen Therapy: Patient Spontanous Breathing  Post-op Assessment: Report given to PACU RN  Post vital signs: Reviewed and stable  Complications: No apparent anesthesia complications

## 2013-12-02 ENCOUNTER — Encounter (HOSPITAL_COMMUNITY): Payer: Self-pay | Admitting: Ophthalmology

## 2015-08-11 DIAGNOSIS — L821 Other seborrheic keratosis: Secondary | ICD-10-CM | POA: Diagnosis not present

## 2015-08-11 DIAGNOSIS — L570 Actinic keratosis: Secondary | ICD-10-CM | POA: Diagnosis not present

## 2015-08-11 DIAGNOSIS — D485 Neoplasm of uncertain behavior of skin: Secondary | ICD-10-CM | POA: Diagnosis not present

## 2015-08-16 DIAGNOSIS — Z23 Encounter for immunization: Secondary | ICD-10-CM | POA: Diagnosis not present

## 2015-09-06 DIAGNOSIS — L570 Actinic keratosis: Secondary | ICD-10-CM | POA: Diagnosis not present

## 2015-09-06 DIAGNOSIS — L219 Seborrheic dermatitis, unspecified: Secondary | ICD-10-CM | POA: Diagnosis not present

## 2015-09-09 DIAGNOSIS — H401434 Capsular glaucoma with pseudoexfoliation of lens, bilateral, indeterminate stage: Secondary | ICD-10-CM | POA: Diagnosis not present

## 2015-09-09 DIAGNOSIS — H2512 Age-related nuclear cataract, left eye: Secondary | ICD-10-CM | POA: Diagnosis not present

## 2015-12-14 DIAGNOSIS — Z1389 Encounter for screening for other disorder: Secondary | ICD-10-CM | POA: Diagnosis not present

## 2015-12-14 DIAGNOSIS — E7800 Pure hypercholesterolemia, unspecified: Secondary | ICD-10-CM | POA: Diagnosis not present

## 2015-12-14 DIAGNOSIS — Z0000 Encounter for general adult medical examination without abnormal findings: Secondary | ICD-10-CM | POA: Diagnosis not present

## 2015-12-14 DIAGNOSIS — Z7189 Other specified counseling: Secondary | ICD-10-CM | POA: Diagnosis not present

## 2015-12-14 DIAGNOSIS — Z299 Encounter for prophylactic measures, unspecified: Secondary | ICD-10-CM | POA: Diagnosis not present

## 2015-12-14 DIAGNOSIS — R5383 Other fatigue: Secondary | ICD-10-CM | POA: Diagnosis not present

## 2015-12-14 DIAGNOSIS — Z6827 Body mass index (BMI) 27.0-27.9, adult: Secondary | ICD-10-CM | POA: Diagnosis not present

## 2015-12-14 DIAGNOSIS — Z1211 Encounter for screening for malignant neoplasm of colon: Secondary | ICD-10-CM | POA: Diagnosis not present

## 2015-12-14 DIAGNOSIS — Z79899 Other long term (current) drug therapy: Secondary | ICD-10-CM | POA: Diagnosis not present

## 2016-02-22 DIAGNOSIS — E2839 Other primary ovarian failure: Secondary | ICD-10-CM | POA: Diagnosis not present

## 2016-03-10 DIAGNOSIS — H401434 Capsular glaucoma with pseudoexfoliation of lens, bilateral, indeterminate stage: Secondary | ICD-10-CM | POA: Diagnosis not present

## 2016-03-10 DIAGNOSIS — H2512 Age-related nuclear cataract, left eye: Secondary | ICD-10-CM | POA: Diagnosis not present

## 2016-05-12 DIAGNOSIS — Z1231 Encounter for screening mammogram for malignant neoplasm of breast: Secondary | ICD-10-CM | POA: Diagnosis not present

## 2016-05-17 DIAGNOSIS — Z713 Dietary counseling and surveillance: Secondary | ICD-10-CM | POA: Diagnosis not present

## 2016-05-17 DIAGNOSIS — M545 Low back pain: Secondary | ICD-10-CM | POA: Diagnosis not present

## 2016-05-17 DIAGNOSIS — Z6828 Body mass index (BMI) 28.0-28.9, adult: Secondary | ICD-10-CM | POA: Diagnosis not present

## 2016-05-17 DIAGNOSIS — K219 Gastro-esophageal reflux disease without esophagitis: Secondary | ICD-10-CM | POA: Diagnosis not present

## 2016-05-23 ENCOUNTER — Encounter (INDEPENDENT_AMBULATORY_CARE_PROVIDER_SITE_OTHER): Payer: Self-pay | Admitting: Internal Medicine

## 2016-05-24 ENCOUNTER — Encounter (INDEPENDENT_AMBULATORY_CARE_PROVIDER_SITE_OTHER): Payer: Self-pay

## 2016-05-24 ENCOUNTER — Encounter (INDEPENDENT_AMBULATORY_CARE_PROVIDER_SITE_OTHER): Payer: Self-pay | Admitting: Internal Medicine

## 2016-05-24 ENCOUNTER — Other Ambulatory Visit (INDEPENDENT_AMBULATORY_CARE_PROVIDER_SITE_OTHER): Payer: Self-pay | Admitting: Internal Medicine

## 2016-05-24 ENCOUNTER — Encounter (INDEPENDENT_AMBULATORY_CARE_PROVIDER_SITE_OTHER): Payer: Self-pay | Admitting: *Deleted

## 2016-05-24 ENCOUNTER — Ambulatory Visit (INDEPENDENT_AMBULATORY_CARE_PROVIDER_SITE_OTHER): Payer: Medicare Other | Admitting: Internal Medicine

## 2016-05-24 VITALS — BP 110/78 | HR 72 | Temp 98.1°F | Resp 18 | Ht 63.0 in | Wt 157.6 lb

## 2016-05-24 DIAGNOSIS — R1310 Dysphagia, unspecified: Secondary | ICD-10-CM

## 2016-05-24 DIAGNOSIS — E7800 Pure hypercholesterolemia, unspecified: Secondary | ICD-10-CM

## 2016-05-24 DIAGNOSIS — K219 Gastro-esophageal reflux disease without esophagitis: Secondary | ICD-10-CM | POA: Diagnosis not present

## 2016-05-24 DIAGNOSIS — R1319 Other dysphagia: Secondary | ICD-10-CM

## 2016-05-24 HISTORY — DX: Pure hypercholesterolemia, unspecified: E78.00

## 2016-05-24 HISTORY — DX: Gastro-esophageal reflux disease without esophagitis: K21.9

## 2016-05-24 NOTE — Patient Instructions (Signed)
EGD/ED. The risks and benefits such as perforation, bleeding, and infection were reviewed with the patient and is agreeable. 

## 2016-05-24 NOTE — Progress Notes (Signed)
   Subjective:    Patient ID: Angelica Rhodes, female    DOB: [birth year], 65 y.o.   MRN: [id number]  HPI Referred by Dr. Woody Seller for GERD/dysphagia. She tells me she keeps indigestion all the time. As long as she takes Nexium which helps. She has been on Nexium for about a year. If she stops the Nexium, the acid reflux is right back. She also tells me foods are lodging in her esophagus. Dysphagia for about a month.  No foods in particular will bother her. Does not have problems with liquids. She was given Protonix last week by Dr. Woody Seller which helps.  Last colonoscopy in 2008 and was normal by Dr. Anthony Sar.     Review of Systems Past Medical History:  Diagnosis Date  . Arthritis   . Cataracts, bilateral   . Complication of anesthesia   . GERD (gastroesophageal reflux disease)    occ  . Glaucoma   . Hyperlipidemia   . Leaky heart valve   . Mental disorder   . PONV (postoperative nausea and vomiting)     Past Surgical History:  Procedure Laterality Date  . BACK SURGERY     lower back  . CATARACT EXTRACTION W/PHACO Right 11/26/2013   Procedure: PHACO EMULSION CATARACT EXTRACTION WITH INTROCULAR LENS IMPLANTS RIGHT EYE;  Surgeon: [name], MD;  Location: [facility];  Service: Ophthalmology;  Laterality: Right;  . CHOLECYSTECTOMY    . TONSILLECTOMY    . TRABECULECTOMY Right 09/11/2012   Procedure: TRABECULECTOMY WITH MITOMYCIN RIGHT EYE WITH GLAUCOMA DEVICE;  Surgeon: [name], MD;  Location: [facility];  Service: Ophthalmology;  Laterality: Right;  . TUBAL LIGATION      No Known Allergies  Current Outpatient Prescriptions on File Prior to Visit  Medication Sig Dispense Refill  . brimonidine-timolol (COMBIGAN) 0.2-0.5 % ophthalmic solution Place 1 drop into the left eye every 12 (twelve) hours.     No current facility-administered medications on file prior to visit.        Objective:   Physical Exam Blood pressure 110/78, pulse 72, temperature 98.1 F (36.7 C), temperature source  Oral, resp. rate 18, height 5\' 3"  (1.6 m), weight 157 lb 9.6 oz (71.5 kg).  Alert and oriented. Skin warm and dry. Oral mucosa is moist.   . Sclera anicteric, conjunctivae is pink. Thyroid not enlarged. No cervical lymphadenopathy. Lungs clear. Heart regular rate and rhythm.  Abdomen is soft. Bowel sounds are positive. No hepatomegaly. No abdominal masses felt. No tenderness.  No edema to lower extremities.  .       Assessment & Plan:  GERD. PUD. Needs to be ruled out. Continue the Protonix. Dysphagia. Stricture needs to ruled out.  EGD/ED. The risks and benefits such as perforation, bleeding, and infection were reviewed with the patient and is agreeable.

## 2016-05-25 ENCOUNTER — Encounter (INDEPENDENT_AMBULATORY_CARE_PROVIDER_SITE_OTHER): Payer: Self-pay

## 2016-05-25 DIAGNOSIS — R1319 Other dysphagia: Secondary | ICD-10-CM | POA: Insufficient documentation

## 2016-05-25 DIAGNOSIS — R1310 Dysphagia, unspecified: Secondary | ICD-10-CM | POA: Insufficient documentation

## 2016-05-25 DIAGNOSIS — K219 Gastro-esophageal reflux disease without esophagitis: Secondary | ICD-10-CM | POA: Insufficient documentation

## 2016-06-19 DIAGNOSIS — D492 Neoplasm of unspecified behavior of bone, soft tissue, and skin: Secondary | ICD-10-CM | POA: Diagnosis not present

## 2016-06-19 DIAGNOSIS — L821 Other seborrheic keratosis: Secondary | ICD-10-CM | POA: Diagnosis not present

## 2016-06-19 DIAGNOSIS — D239 Other benign neoplasm of skin, unspecified: Secondary | ICD-10-CM | POA: Diagnosis not present

## 2016-06-22 DIAGNOSIS — Z23 Encounter for immunization: Secondary | ICD-10-CM | POA: Diagnosis not present

## 2016-07-05 DIAGNOSIS — Z299 Encounter for prophylactic measures, unspecified: Secondary | ICD-10-CM | POA: Diagnosis not present

## 2016-07-05 DIAGNOSIS — Z87891 Personal history of nicotine dependence: Secondary | ICD-10-CM | POA: Diagnosis not present

## 2016-07-05 DIAGNOSIS — Z6827 Body mass index (BMI) 27.0-27.9, adult: Secondary | ICD-10-CM | POA: Diagnosis not present

## 2016-07-05 DIAGNOSIS — J069 Acute upper respiratory infection, unspecified: Secondary | ICD-10-CM | POA: Diagnosis not present

## 2016-07-05 DIAGNOSIS — Z713 Dietary counseling and surveillance: Secondary | ICD-10-CM | POA: Diagnosis not present

## 2016-07-05 DIAGNOSIS — M545 Low back pain: Secondary | ICD-10-CM | POA: Diagnosis not present

## 2016-07-14 ENCOUNTER — Ambulatory Visit (HOSPITAL_COMMUNITY): Admission: RE | Admit: 2016-07-14 | Payer: Medicare Other | Source: Ambulatory Visit | Admitting: Internal Medicine

## 2016-07-14 ENCOUNTER — Encounter (HOSPITAL_COMMUNITY): Admission: RE | Payer: Self-pay | Source: Ambulatory Visit

## 2016-07-14 SURGERY — EGD (ESOPHAGOGASTRODUODENOSCOPY)
Anesthesia: Moderate Sedation

## 2016-09-12 DIAGNOSIS — C44612 Basal cell carcinoma of skin of right upper limb, including shoulder: Secondary | ICD-10-CM | POA: Diagnosis not present

## 2016-09-12 DIAGNOSIS — L570 Actinic keratosis: Secondary | ICD-10-CM | POA: Diagnosis not present

## 2016-09-12 DIAGNOSIS — L821 Other seborrheic keratosis: Secondary | ICD-10-CM | POA: Diagnosis not present

## 2016-09-12 DIAGNOSIS — D229 Melanocytic nevi, unspecified: Secondary | ICD-10-CM | POA: Diagnosis not present

## 2016-09-12 DIAGNOSIS — D492 Neoplasm of unspecified behavior of bone, soft tissue, and skin: Secondary | ICD-10-CM | POA: Diagnosis not present

## 2016-09-26 DIAGNOSIS — H2512 Age-related nuclear cataract, left eye: Secondary | ICD-10-CM | POA: Diagnosis not present

## 2016-09-26 DIAGNOSIS — H401434 Capsular glaucoma with pseudoexfoliation of lens, bilateral, indeterminate stage: Secondary | ICD-10-CM | POA: Diagnosis not present

## 2016-10-05 DIAGNOSIS — D492 Neoplasm of unspecified behavior of bone, soft tissue, and skin: Secondary | ICD-10-CM | POA: Diagnosis not present

## 2016-10-05 DIAGNOSIS — L570 Actinic keratosis: Secondary | ICD-10-CM | POA: Diagnosis not present

## 2016-10-05 DIAGNOSIS — C44612 Basal cell carcinoma of skin of right upper limb, including shoulder: Secondary | ICD-10-CM | POA: Diagnosis not present

## 2016-12-20 DIAGNOSIS — Z1389 Encounter for screening for other disorder: Secondary | ICD-10-CM | POA: Diagnosis not present

## 2016-12-20 DIAGNOSIS — C439 Malignant melanoma of skin, unspecified: Secondary | ICD-10-CM | POA: Diagnosis not present

## 2016-12-20 DIAGNOSIS — Z299 Encounter for prophylactic measures, unspecified: Secondary | ICD-10-CM | POA: Diagnosis not present

## 2016-12-20 DIAGNOSIS — M19042 Primary osteoarthritis, left hand: Secondary | ICD-10-CM | POA: Diagnosis not present

## 2016-12-20 DIAGNOSIS — Z1211 Encounter for screening for malignant neoplasm of colon: Secondary | ICD-10-CM | POA: Diagnosis not present

## 2016-12-20 DIAGNOSIS — E785 Hyperlipidemia, unspecified: Secondary | ICD-10-CM | POA: Diagnosis not present

## 2016-12-20 DIAGNOSIS — R5383 Other fatigue: Secondary | ICD-10-CM | POA: Diagnosis not present

## 2016-12-20 DIAGNOSIS — M19041 Primary osteoarthritis, right hand: Secondary | ICD-10-CM | POA: Diagnosis not present

## 2016-12-20 DIAGNOSIS — Z7189 Other specified counseling: Secondary | ICD-10-CM | POA: Diagnosis not present

## 2016-12-20 DIAGNOSIS — Z79899 Other long term (current) drug therapy: Secondary | ICD-10-CM | POA: Diagnosis not present

## 2016-12-20 DIAGNOSIS — Z6827 Body mass index (BMI) 27.0-27.9, adult: Secondary | ICD-10-CM | POA: Diagnosis not present

## 2016-12-20 DIAGNOSIS — Z0000 Encounter for general adult medical examination without abnormal findings: Secondary | ICD-10-CM | POA: Diagnosis not present

## 2016-12-21 ENCOUNTER — Encounter (INDEPENDENT_AMBULATORY_CARE_PROVIDER_SITE_OTHER): Payer: Self-pay | Admitting: *Deleted

## 2016-12-25 DIAGNOSIS — I350 Nonrheumatic aortic (valve) stenosis: Secondary | ICD-10-CM | POA: Diagnosis not present

## 2017-01-18 ENCOUNTER — Encounter (INDEPENDENT_AMBULATORY_CARE_PROVIDER_SITE_OTHER): Payer: Self-pay | Admitting: *Deleted

## 2017-01-18 ENCOUNTER — Other Ambulatory Visit (INDEPENDENT_AMBULATORY_CARE_PROVIDER_SITE_OTHER): Payer: Self-pay | Admitting: *Deleted

## 2017-01-18 DIAGNOSIS — Z1211 Encounter for screening for malignant neoplasm of colon: Secondary | ICD-10-CM

## 2017-01-31 DIAGNOSIS — H2512 Age-related nuclear cataract, left eye: Secondary | ICD-10-CM | POA: Diagnosis not present

## 2017-01-31 DIAGNOSIS — H401432 Capsular glaucoma with pseudoexfoliation of lens, bilateral, moderate stage: Secondary | ICD-10-CM | POA: Diagnosis not present

## 2017-02-08 DIAGNOSIS — M722 Plantar fascial fibromatosis: Secondary | ICD-10-CM | POA: Diagnosis not present

## 2017-02-08 DIAGNOSIS — M7742 Metatarsalgia, left foot: Secondary | ICD-10-CM | POA: Diagnosis not present

## 2017-03-13 DIAGNOSIS — Z01419 Encounter for gynecological examination (general) (routine) without abnormal findings: Secondary | ICD-10-CM | POA: Diagnosis not present

## 2017-03-13 DIAGNOSIS — Z124 Encounter for screening for malignant neoplasm of cervix: Secondary | ICD-10-CM | POA: Diagnosis not present

## 2017-03-20 DIAGNOSIS — R945 Abnormal results of liver function studies: Secondary | ICD-10-CM | POA: Diagnosis not present

## 2017-04-09 DIAGNOSIS — Z299 Encounter for prophylactic measures, unspecified: Secondary | ICD-10-CM | POA: Diagnosis not present

## 2017-04-09 DIAGNOSIS — K219 Gastro-esophageal reflux disease without esophagitis: Secondary | ICD-10-CM | POA: Diagnosis not present

## 2017-04-09 DIAGNOSIS — R42 Dizziness and giddiness: Secondary | ICD-10-CM | POA: Diagnosis not present

## 2017-04-09 DIAGNOSIS — Z6827 Body mass index (BMI) 27.0-27.9, adult: Secondary | ICD-10-CM | POA: Diagnosis not present

## 2017-04-09 DIAGNOSIS — M545 Low back pain: Secondary | ICD-10-CM | POA: Diagnosis not present

## 2017-04-11 DIAGNOSIS — M4802 Spinal stenosis, cervical region: Secondary | ICD-10-CM | POA: Diagnosis not present

## 2017-04-11 DIAGNOSIS — M542 Cervicalgia: Secondary | ICD-10-CM | POA: Diagnosis not present

## 2017-04-11 DIAGNOSIS — M50323 Other cervical disc degeneration at C6-C7 level: Secondary | ICD-10-CM | POA: Diagnosis not present

## 2017-04-11 DIAGNOSIS — M50322 Other cervical disc degeneration at C5-C6 level: Secondary | ICD-10-CM | POA: Diagnosis not present

## 2017-04-11 DIAGNOSIS — R42 Dizziness and giddiness: Secondary | ICD-10-CM | POA: Diagnosis not present

## 2017-04-17 ENCOUNTER — Telehealth (INDEPENDENT_AMBULATORY_CARE_PROVIDER_SITE_OTHER): Payer: Self-pay | Admitting: *Deleted

## 2017-04-17 ENCOUNTER — Encounter (INDEPENDENT_AMBULATORY_CARE_PROVIDER_SITE_OTHER): Payer: Self-pay | Admitting: *Deleted

## 2017-04-17 MED ORDER — PEG 3350-KCL-NA BICARB-NACL 420 G PO SOLR: 4000.0000 mL | Freq: Once | ORAL | 0 refills | Status: AC

## 2017-04-17 NOTE — Telephone Encounter (Signed)
Patient needs trilyte 

## 2017-05-02 ENCOUNTER — Telehealth (INDEPENDENT_AMBULATORY_CARE_PROVIDER_SITE_OTHER): Payer: Self-pay | Admitting: *Deleted

## 2017-05-02 NOTE — Telephone Encounter (Signed)
Referring MD/PCP: vyas   Procedure: tcs  Reason/Indication:  screening  Has patient had this procedure before?  Yes, 10 yrs ago  If so, when, by whom and where?    Is there a family history of colon cancer?  no  Who?  What age when diagnosed?    Is patient diabetic?   no      Does patient have prosthetic heart valve or mechanical valve?  no  Do you have a pacemaker?  no  Has patient ever had endocarditis? no  Has patient had joint replacement within last 12 months?  no  Is patient constipated or take laxatives? no  Does patient have a history of alcohol/drug use?  no  Is patient on Coumadin, Plavix and/or Aspirin? no  Medications: pantoprazole 40 mg daily, atorvastatin 10 mg daily, combigan eye drop, xalatan eye drop  Allergies: nkda  Medication Adjustment per Dr Laural Golden:   Procedure date & time: 05/31/17 at 730

## 2017-05-02 NOTE — Telephone Encounter (Signed)
agree

## 2017-05-03 DIAGNOSIS — Z23 Encounter for immunization: Secondary | ICD-10-CM | POA: Diagnosis not present

## 2017-05-31 ENCOUNTER — Encounter (HOSPITAL_COMMUNITY)
Admission: RE | Disposition: A | Discharge status: Home / Self Care | Payer: Self-pay | Source: Ambulatory Visit | Attending: Internal Medicine

## 2017-05-31 ENCOUNTER — Ambulatory Visit (HOSPITAL_COMMUNITY)
Admission: RE | Admit: 2017-05-31 | Discharge: 2017-05-31 | Disposition: A | Discharge status: Home / Self Care | Payer: Medicare Other | Source: Ambulatory Visit | Attending: Internal Medicine | Admitting: Internal Medicine

## 2017-05-31 ENCOUNTER — Encounter (HOSPITAL_COMMUNITY): Payer: Self-pay | Admitting: *Deleted

## 2017-05-31 DIAGNOSIS — K219 Gastro-esophageal reflux disease without esophagitis: Secondary | ICD-10-CM | POA: Insufficient documentation

## 2017-05-31 DIAGNOSIS — K644 Residual hemorrhoidal skin tags: Secondary | ICD-10-CM | POA: Diagnosis not present

## 2017-05-31 DIAGNOSIS — Z1211 Encounter for screening for malignant neoplasm of colon: Secondary | ICD-10-CM | POA: Insufficient documentation

## 2017-05-31 DIAGNOSIS — K5730 Diverticulosis of large intestine without perforation or abscess without bleeding: Secondary | ICD-10-CM | POA: Diagnosis not present

## 2017-05-31 DIAGNOSIS — E7800 Pure hypercholesterolemia, unspecified: Secondary | ICD-10-CM | POA: Diagnosis not present

## 2017-05-31 DIAGNOSIS — Z79899 Other long term (current) drug therapy: Secondary | ICD-10-CM | POA: Insufficient documentation

## 2017-05-31 DIAGNOSIS — Z87891 Personal history of nicotine dependence: Secondary | ICD-10-CM | POA: Insufficient documentation

## 2017-05-31 DIAGNOSIS — Q438 Other specified congenital malformations of intestine: Secondary | ICD-10-CM | POA: Diagnosis not present

## 2017-05-31 DIAGNOSIS — D125 Benign neoplasm of sigmoid colon: Secondary | ICD-10-CM | POA: Diagnosis not present

## 2017-05-31 DIAGNOSIS — M1990 Unspecified osteoarthritis, unspecified site: Secondary | ICD-10-CM | POA: Insufficient documentation

## 2017-05-31 DIAGNOSIS — H409 Unspecified glaucoma: Secondary | ICD-10-CM | POA: Diagnosis not present

## 2017-05-31 HISTORY — PX: COLONOSCOPY: SHX5424

## 2017-05-31 SURGERY — COLONOSCOPY
Anesthesia: Moderate Sedation

## 2017-05-31 MED ORDER — ATROPINE SULFATE 1 MG/ML IJ SOLN
INTRAMUSCULAR | Status: AC
  Filled 2017-05-31: qty 1

## 2017-05-31 MED ORDER — SIMETHICONE 40 MG/0.6ML PO SUSP
ORAL | Status: DC | PRN
  Administered 2017-05-31: 08:00:00

## 2017-05-31 MED ORDER — MIDAZOLAM HCL 5 MG/5ML IJ SOLN
INTRAMUSCULAR | Status: AC
  Filled 2017-05-31: qty 10

## 2017-05-31 MED ORDER — FLUMAZENIL 0.5 MG/5ML IV SOLN
INTRAVENOUS | Status: AC
  Filled 2017-05-31: qty 5

## 2017-05-31 MED ORDER — NALOXONE HCL 0.4 MG/ML IJ SOLN
INTRAMUSCULAR | Status: AC
  Filled 2017-05-31: qty 1

## 2017-05-31 MED ORDER — MEPERIDINE HCL 50 MG/ML IJ SOLN
INTRAMUSCULAR | Status: DC | PRN
  Administered 2017-05-31 (×2): 25 mg via INTRAVENOUS

## 2017-05-31 MED ORDER — MIDAZOLAM HCL 5 MG/5ML IJ SOLN
INTRAMUSCULAR | Status: DC | PRN
  Administered 2017-05-31 (×2): 1 mg via INTRAVENOUS
  Administered 2017-05-31: 2 mg via INTRAVENOUS
  Administered 2017-05-31 (×2): 1 mg via INTRAVENOUS

## 2017-05-31 MED ORDER — EPINEPHRINE PF 1 MG/10ML IJ SOSY
PREFILLED_SYRINGE | INTRAMUSCULAR | Status: AC
  Filled 2017-05-31: qty 10

## 2017-05-31 MED ORDER — SODIUM CHLORIDE 0.9 % IV SOLN
INTRAVENOUS | Status: DC
Start: 2017-05-31 — End: 2017-05-31
  Administered 2017-05-31: 07:00:00 via INTRAVENOUS

## 2017-05-31 MED ORDER — MEPERIDINE HCL 50 MG/ML IJ SOLN
INTRAMUSCULAR | Status: AC
  Filled 2017-05-31: qty 1

## 2017-05-31 NOTE — Discharge Instructions (Signed)
Colon Polyps Polyps are tissue growths inside the body. Polyps can grow in many places, including the large intestine (colon). A polyp may be a round bump or a mushroom-shaped growth. You could have one polyp or several. Most colon polyps are noncancerous (benign). However, some colon polyps can become cancerous over time. What are the causes? The exact cause of colon polyps is not known. What increases the risk? This condition is more likely to develop in people who:  Have a family history of colon cancer or colon polyps.  Are older than 56 or older than 45 if they are African American.  Have inflammatory bowel disease, such as ulcerative colitis or Crohn disease.  Are overweight.  Smoke cigarettes.  Do not get enough exercise.  Drink too much alcohol.  Eat a diet that is: ? High in fat and red meat. ? Low in fiber.  Had childhood cancer that was treated with abdominal radiation.  What are the signs or symptoms? Most polyps do not cause symptoms. If you have symptoms, they may include:  Blood coming from your rectum when having a bowel movement.  Blood in your stool.The stool may look dark red or black.  A change in bowel habits, such as constipation or diarrhea.  How is this diagnosed? This condition is diagnosed with a colonoscopy. This is a procedure that uses a lighted, flexible scope to look at the inside of your colon. How is this treated? Treatment for this condition involves removing any polyps that are found. Those polyps will then be tested for cancer. If cancer is found, your health care provider will talk to you about options for colon cancer treatment. Follow these instructions at home: Diet  Eat plenty of fiber, such as fruits, vegetables, and whole grains.  Eat foods that are high in calcium and vitamin D, such as milk, cheese, yogurt, eggs, liver, fish, and broccoli.  Limit foods high in fat, red meats, and processed meats, such as hot dogs,  sausage, bacon, and lunch meats.  Maintain a healthy weight, or lose weight if recommended by your health care provider. General instructions  Do not smoke cigarettes.  Do not drink alcohol excessively.  Keep all follow-up visits as told by your health care provider. This is important. This includes keeping regularly scheduled colonoscopies. Talk to your health care provider about when you need a colonoscopy.  Exercise every day or as told by your health care provider. Contact a health care provider if:  You have new or worsening bleeding during a bowel movement.  You have new or increased blood in your stool.  You have a change in bowel habits.  You unexpectedly lose weight. This information is not intended to replace advice given to you by your health care provider. Make sure you discuss any questions you have with your health care provider. Document Released: 03/29/2004 Document Revised: 12/09/2015 Document Reviewed: 05/24/2015 Elsevier Interactive Patient Education  2018 [employer].   Colonoscopy, Adult, Care After This sheet gives you information about how to care for yourself after your procedure. Your doctor may also give you more specific instructions. If you have problems or questions, call your doctor. Follow these instructions at home: General instructions   For the first 24 hours after the procedure: ? Do not drive or use machinery. ? Do not sign important documents. ? Do not drink alcohol. ? Do your daily activities more slowly than normal. ? Eat foods that are soft and easy to digest. ? Rest  often.  Take over-the-counter or prescription medicines only as told by your doctor.  It is up to you to get the results of your procedure. Ask your doctor, or the department performing the procedure, when your results will be ready. To help cramping and bloating:  Try walking around.  Put heat on your belly (abdomen) as told by your doctor. Use a heat source that  your doctor recommends, such as a moist heat pack or a heating pad. ? Put a towel between your skin and the heat source. ? Leave the heat on for 20-30 minutes. ? Remove the heat if your skin turns bright red. This is especially important if you cannot feel pain, heat, or cold. You can get burned. Eating and drinking  Drink enough fluid to keep your pee (urine) clear or pale yellow.  Return to your normal diet as told by your doctor. Avoid heavy or fried foods that are hard to digest.  Avoid drinking alcohol for as long as told by your doctor. Contact a doctor if:  You have blood in your poop (stool) 2-3 days after the procedure. Get help right away if:  You have more than a small amount of blood in your poop.  You see large clumps of tissue (blood clots) in your poop.  Your belly is swollen.  You feel sick to your stomach (nauseous).  You throw up (vomit).  You have a fever.  You have belly pain that gets worse, and medicine does not help your pain. This information is not intended to replace advice given to you by your health care provider. Make sure you discuss any questions you have with your health care provider. Document Released: 08/05/2010 Document Revised: 03/27/2016 Document Reviewed: 03/27/2016 Elsevier Interactive Patient Education  2017 [place] usual medications and diet as before. No driving for 24 hours. Physician will call with biopsy results.

## 2017-05-31 NOTE — Op Note (Signed)
[facility] Patient Name: Angelica Rhodes Procedure Date: 05/31/2017 7:01 AM MRN: [id number] Date of Birth: 01/31/1951 Attending MD: Hildred Laser , MD CSN: [id number] Age: 66 Admit Type: Outpatient Procedure:                Colonoscopy Indications:              Screening for colorectal malignant neoplasm Providers:                Hildred Laser, MD, Hinton Rao, RN, Zoila Shutter,                            Technologist Referring MD:             Glenda Chroman, MD Medicines:                Meperidine 50 mg IV, Midazolam 6 mg IV Complications:            No immediate complications. Estimated Blood Loss:     Estimated blood loss was minimal. Procedure:                Pre-Anesthesia Assessment:                           - Prior to the procedure, a History and Physical                            was performed, and patient medications and                            allergies were reviewed. The patient's tolerance of                            previous anesthesia was also reviewed. The risks                            and benefits of the procedure and the sedation                            options and risks were discussed with the patient.                            All questions were answered, and informed consent                            was obtained. Prior Anticoagulants: The patient has                            taken no previous anticoagulant or antiplatelet                            agents. ASA Grade Assessment: II - A patient with                            mild systemic disease. After reviewing the risks  and benefits, the patient was deemed in                            satisfactory condition to undergo the procedure.                           After obtaining informed consent, the colonoscope                            was passed under direct vision. Throughout the                            procedure, the patient's blood pressure, pulse, and                         oxygen saturations were monitored continuously. The                            EC-[id number] ([id number]) scope was introduced through                            the anus and advanced to the the cecum, identified                            by appendiceal orifice and ileocecal valve. The                            colonoscopy was technically difficult and complex                            due to a tortuous colon. Successful completion of                            the procedure was aided by increasing the dose of                            sedation medication, changing the patient's                            position, using manual pressure and withdrawing and                            reinserting the scope. The patient tolerated the                            procedure well. The quality of the bowel                            preparation was excellent. The ileocecal valve,                            appendiceal orifice, and rectum were photographed. Scope In: Scope Out: 8:11:17 AM Scope Withdrawal Time: 0 hours 6 minutes 14 seconds  Findings:      The  perianal and digital rectal examinations were normal.      A small polyp was found in the proximal sigmoid colon. The polyp was       sessile. Biopsies were taken with a cold forceps for histology.      A single small-mouthed diverticulum was found in the sigmoid colon.      External hemorrhoids were found during retroflexion. The hemorrhoids       were small. Impression:               - One small polyp in the proximal sigmoid colon.                            Biopsied.                           - Diverticulosis in the sigmoid colon.                           - External hemorrhoids. Moderate Sedation:      Moderate (conscious) sedation was administered by the endoscopy nurse       and supervised by the endoscopist. The following parameters were       monitored: oxygen saturation, heart rate, blood pressure, CO2        capnography and response to care. Total physician intraservice time was       34 minutes. Recommendation:           - Patient has a contact number available for                            emergencies. The signs and symptoms of potential                            delayed complications were discussed with the                            patient. Return to normal activities tomorrow.                            Written discharge instructions were provided to the                            patient.                           - Resume previous diet today.                           - Continue present medications.                           - Await pathology results.                           - Repeat colonoscopy date to be determined after                            pending pathology results are reviewed. Procedure Code(s):        ---  Professional ---                           [id number], Colonoscopy, flexible; with biopsy, single                            or multiple                           99152, Moderate sedation services provided by the                            same physician or other qualified health care                            professional performing the diagnostic or                            therapeutic service that the sedation supports,                            requiring the presence of an independent trained                            observer to assist in the monitoring of the                            patient's level of consciousness and physiological                            status; initial 15 minutes of intraservice time,                            patient age 49 years or older                           [phone], Moderate sedation services; each additional                            15 minutes intraservice time Diagnosis Code(s):        --- Professional ---                           Z12.11, Encounter for screening for malignant                            neoplasm of colon                            D12.5, Benign neoplasm of sigmoid colon                           K64.4, Residual hemorrhoidal skin tags                           K57.30, Diverticulosis of large intestine without  perforation or abscess without bleeding CPT copyright 2016 American Medical Association. All rights reserved. The codes documented in this report are preliminary and upon coder review may  be revised to meet current compliance requirements. Hildred Laser, MD Hildred Laser, MD 05/31/2017 8:20:29 AM This report has been signed electronically. Number of Addenda: 0

## 2017-05-31 NOTE — H&P (Signed)
Angelica Rhodes is an 66 y.o. female.   Chief Complaint: Patient is here for colonoscopy. HPI: Patient is 66 year old Caucasian female who is here for screening colonoscopy.  Last exam was normal 10 years ago.  She denies abdominal pain change in bowel habits or rectal bleeding. Family history is negative for CRC.  Past Medical History:  Diagnosis Date  . Arthritis   . Cataracts, bilateral   . Complication of anesthesia   . GERD (gastroesophageal reflux disease)    occ    05/24/2016  . Glaucoma   . High cholesterol 05/24/2016  . Hyperlipidemia   . Leaky heart valve       . PONV (postoperative nausea and vomiting)     Past Surgical History:  Procedure Laterality Date  . BACK SURGERY     lower back  . CATARACT EXTRACTION W/PHACO Right 11/26/2013   Procedure: PHACO EMULSION CATARACT EXTRACTION WITH INTROCULAR LENS IMPLANTS RIGHT EYE;  Surgeon: [name], MD;  Location: [place];  Service: Ophthalmology;  Laterality: Right;  . CHOLECYSTECTOMY    . TONSILLECTOMY    . TRABECULECTOMY Right 09/11/2012   Procedure: TRABECULECTOMY WITH MITOMYCIN RIGHT EYE WITH GLAUCOMA DEVICE;  Surgeon: [name], MD;  Location: [place];  Service: Ophthalmology;  Laterality: Right;  . TUBAL LIGATION      Family History  Problem Relation Age of Onset  . Hemochromatosis Maternal Grandmother    Social History:  reports that she has quit smoking. She quit after 25.00 years of use. she has never used smokeless tobacco. She reports that she does not drink alcohol or use drugs.  Allergies: No Known Allergies  Medications Prior to Admission  Medication Sig Dispense Refill  . atorvastatin (LIPITOR) 10 MG tablet Take 10 mg at bedtime by mouth.    . brimonidine-timolol (COMBIGAN) 0.2-0.5 % ophthalmic solution Place 1 drop into the left eye every 12 (twelve) hours.    . dorzolamide (TRUSOPT) 2 % ophthalmic solution Place 1 drop 2 (two) times daily into the left eye.     . Glucos-Chond-Hyal Ac-Ca Fructo (MOVE  FREE JOINT HEALTH ADVANCE PO) Take 2 tablets daily by mouth.    Marland Kitchen ibuprofen (ADVIL,MOTRIN) 200 MG tablet Take 200 mg every 8 (eight) hours as needed by mouth (for pain. (typically no more than once a week)).      No results found for this or any previous visit (from the past 48 hour(s)). No results found.  ROS  Blood pressure (!) 113/56, pulse 84, temperature 97.8 F (36.6 C), temperature source Oral, resp. rate 18, SpO2 97 %. Physical Exam  Constitutional: She appears well-developed and well-nourished.  HENT:  Mouth/Throat: Oropharynx is clear and moist.  Eyes: Conjunctivae are normal. No scleral icterus.  Neck: No thyromegaly present.  Cardiovascular: Normal rate, regular rhythm and normal heart sounds.  No murmur heard. Respiratory: Effort normal and breath sounds normal.  GI: Soft. She exhibits no distension and no mass. There is no tenderness.  Musculoskeletal: She exhibits no edema.  Lymphadenopathy:    She has no cervical adenopathy.  Neurological: She is alert.  Skin: Skin is warm and dry.     Assessment/Plan Average risk screening colonoscopy.  Hildred Laser, MD 05/31/2017, 7:32 AM

## 2017-06-04 ENCOUNTER — Encounter (HOSPITAL_COMMUNITY): Payer: Self-pay | Admitting: Internal Medicine

## 2017-06-14 DIAGNOSIS — H401432 Capsular glaucoma with pseudoexfoliation of lens, bilateral, moderate stage: Secondary | ICD-10-CM | POA: Diagnosis not present

## 2017-06-14 DIAGNOSIS — H43813 Vitreous degeneration, bilateral: Secondary | ICD-10-CM | POA: Diagnosis not present

## 2017-06-14 DIAGNOSIS — H2512 Age-related nuclear cataract, left eye: Secondary | ICD-10-CM | POA: Diagnosis not present

## 2017-10-17 ENCOUNTER — Ambulatory Visit (INDEPENDENT_AMBULATORY_CARE_PROVIDER_SITE_OTHER): Payer: Medicare Other | Admitting: Orthopaedic Surgery

## 2017-12-20 DIAGNOSIS — H2512 Age-related nuclear cataract, left eye: Secondary | ICD-10-CM | POA: Diagnosis not present

## 2017-12-20 DIAGNOSIS — Z961 Presence of intraocular lens: Secondary | ICD-10-CM | POA: Diagnosis not present

## 2017-12-20 DIAGNOSIS — H401432 Capsular glaucoma with pseudoexfoliation of lens, bilateral, moderate stage: Secondary | ICD-10-CM | POA: Diagnosis not present

## 2017-12-26 DIAGNOSIS — Z79899 Other long term (current) drug therapy: Secondary | ICD-10-CM | POA: Diagnosis not present

## 2017-12-26 DIAGNOSIS — Z1331 Encounter for screening for depression: Secondary | ICD-10-CM | POA: Diagnosis not present

## 2017-12-26 DIAGNOSIS — Z299 Encounter for prophylactic measures, unspecified: Secondary | ICD-10-CM | POA: Diagnosis not present

## 2017-12-26 DIAGNOSIS — Z1211 Encounter for screening for malignant neoplasm of colon: Secondary | ICD-10-CM | POA: Diagnosis not present

## 2017-12-26 DIAGNOSIS — E7800 Pure hypercholesterolemia, unspecified: Secondary | ICD-10-CM | POA: Diagnosis not present

## 2017-12-26 DIAGNOSIS — Z0000 Encounter for general adult medical examination without abnormal findings: Secondary | ICD-10-CM | POA: Diagnosis not present

## 2017-12-26 DIAGNOSIS — Z1339 Encounter for screening examination for other mental health and behavioral disorders: Secondary | ICD-10-CM | POA: Diagnosis not present

## 2017-12-26 DIAGNOSIS — Z6828 Body mass index (BMI) 28.0-28.9, adult: Secondary | ICD-10-CM | POA: Diagnosis not present

## 2017-12-26 DIAGNOSIS — R5383 Other fatigue: Secondary | ICD-10-CM | POA: Diagnosis not present

## 2017-12-26 DIAGNOSIS — Z7189 Other specified counseling: Secondary | ICD-10-CM | POA: Diagnosis not present

## 2017-12-26 DIAGNOSIS — K219 Gastro-esophageal reflux disease without esophagitis: Secondary | ICD-10-CM | POA: Diagnosis not present

## 2018-01-15 DIAGNOSIS — M79672 Pain in left foot: Secondary | ICD-10-CM | POA: Diagnosis not present

## 2018-01-15 DIAGNOSIS — M7742 Metatarsalgia, left foot: Secondary | ICD-10-CM | POA: Diagnosis not present

## 2018-03-04 DIAGNOSIS — L280 Lichen simplex chronicus: Secondary | ICD-10-CM | POA: Diagnosis not present

## 2018-03-04 DIAGNOSIS — N814 Uterovaginal prolapse, unspecified: Secondary | ICD-10-CM | POA: Diagnosis not present

## 2018-03-04 DIAGNOSIS — N816 Rectocele: Secondary | ICD-10-CM | POA: Diagnosis not present

## 2018-03-15 DIAGNOSIS — R945 Abnormal results of liver function studies: Secondary | ICD-10-CM | POA: Diagnosis not present

## 2018-03-28 DIAGNOSIS — Z1231 Encounter for screening mammogram for malignant neoplasm of breast: Secondary | ICD-10-CM | POA: Diagnosis not present

## 2018-04-25 DIAGNOSIS — Z23 Encounter for immunization: Secondary | ICD-10-CM | POA: Diagnosis not present

## 2018-04-30 DIAGNOSIS — R945 Abnormal results of liver function studies: Secondary | ICD-10-CM | POA: Diagnosis not present

## 2018-04-30 DIAGNOSIS — Z6826 Body mass index (BMI) 26.0-26.9, adult: Secondary | ICD-10-CM | POA: Diagnosis not present

## 2018-04-30 DIAGNOSIS — K219 Gastro-esophageal reflux disease without esophagitis: Secondary | ICD-10-CM | POA: Diagnosis not present

## 2018-04-30 DIAGNOSIS — Z299 Encounter for prophylactic measures, unspecified: Secondary | ICD-10-CM | POA: Diagnosis not present

## 2018-04-30 DIAGNOSIS — C439 Malignant melanoma of skin, unspecified: Secondary | ICD-10-CM | POA: Diagnosis not present

## 2018-05-06 DIAGNOSIS — N200 Calculus of kidney: Secondary | ICD-10-CM | POA: Diagnosis not present

## 2018-05-06 DIAGNOSIS — K7581 Nonalcoholic steatohepatitis (NASH): Secondary | ICD-10-CM | POA: Diagnosis not present

## 2018-06-27 DIAGNOSIS — H43811 Vitreous degeneration, right eye: Secondary | ICD-10-CM | POA: Diagnosis not present

## 2018-06-27 DIAGNOSIS — Z961 Presence of intraocular lens: Secondary | ICD-10-CM | POA: Diagnosis not present

## 2018-06-27 DIAGNOSIS — H2512 Age-related nuclear cataract, left eye: Secondary | ICD-10-CM | POA: Diagnosis not present

## 2018-06-27 DIAGNOSIS — G43B0 Ophthalmoplegic migraine, not intractable: Secondary | ICD-10-CM | POA: Diagnosis not present

## 2018-06-27 DIAGNOSIS — H401432 Capsular glaucoma with pseudoexfoliation of lens, bilateral, moderate stage: Secondary | ICD-10-CM | POA: Diagnosis not present

## 2018-07-02 DIAGNOSIS — R945 Abnormal results of liver function studies: Secondary | ICD-10-CM | POA: Diagnosis not present

## 2018-07-02 DIAGNOSIS — Z299 Encounter for prophylactic measures, unspecified: Secondary | ICD-10-CM | POA: Diagnosis not present

## 2018-07-02 DIAGNOSIS — Z6826 Body mass index (BMI) 26.0-26.9, adult: Secondary | ICD-10-CM | POA: Diagnosis not present

## 2018-07-02 DIAGNOSIS — N814 Uterovaginal prolapse, unspecified: Secondary | ICD-10-CM | POA: Diagnosis not present

## 2018-07-22 DIAGNOSIS — N952 Postmenopausal atrophic vaginitis: Secondary | ICD-10-CM | POA: Diagnosis not present

## 2018-07-22 DIAGNOSIS — Z124 Encounter for screening for malignant neoplasm of cervix: Secondary | ICD-10-CM | POA: Diagnosis not present

## 2018-07-22 DIAGNOSIS — N8111 Cystocele, midline: Secondary | ICD-10-CM | POA: Diagnosis not present

## 2018-07-22 DIAGNOSIS — Z01419 Encounter for gynecological examination (general) (routine) without abnormal findings: Secondary | ICD-10-CM | POA: Diagnosis not present

## 2018-07-22 DIAGNOSIS — N814 Uterovaginal prolapse, unspecified: Secondary | ICD-10-CM | POA: Diagnosis not present

## 2018-08-23 DIAGNOSIS — N898 Other specified noninflammatory disorders of vagina: Secondary | ICD-10-CM | POA: Diagnosis not present

## 2018-08-23 DIAGNOSIS — N814 Uterovaginal prolapse, unspecified: Secondary | ICD-10-CM | POA: Diagnosis not present

## 2019-03-12 DIAGNOSIS — H401432 Capsular glaucoma with pseudoexfoliation of lens, bilateral, moderate stage: Secondary | ICD-10-CM | POA: Diagnosis not present

## 2019-03-12 DIAGNOSIS — G453 Amaurosis fugax: Secondary | ICD-10-CM | POA: Diagnosis not present

## 2019-03-12 DIAGNOSIS — H2512 Age-related nuclear cataract, left eye: Secondary | ICD-10-CM | POA: Diagnosis not present

## 2019-03-12 DIAGNOSIS — Z961 Presence of intraocular lens: Secondary | ICD-10-CM | POA: Diagnosis not present

## 2019-03-13 DIAGNOSIS — J90 Pleural effusion, not elsewhere classified: Secondary | ICD-10-CM | POA: Diagnosis not present

## 2019-03-13 DIAGNOSIS — I82C12 Acute embolism and thrombosis of left internal jugular vein: Secondary | ICD-10-CM | POA: Diagnosis not present

## 2019-03-13 DIAGNOSIS — I700 Atherosclerosis of aorta: Secondary | ICD-10-CM | POA: Diagnosis not present

## 2019-03-13 DIAGNOSIS — R590 Localized enlarged lymph nodes: Secondary | ICD-10-CM | POA: Diagnosis not present

## 2019-03-13 DIAGNOSIS — R591 Generalized enlarged lymph nodes: Secondary | ICD-10-CM | POA: Diagnosis not present

## 2019-03-13 DIAGNOSIS — R221 Localized swelling, mass and lump, neck: Secondary | ICD-10-CM | POA: Diagnosis not present

## 2019-03-13 DIAGNOSIS — S20212A Contusion of left front wall of thorax, initial encounter: Secondary | ICD-10-CM | POA: Diagnosis not present

## 2019-03-13 DIAGNOSIS — M542 Cervicalgia: Secondary | ICD-10-CM | POA: Diagnosis not present

## 2019-03-14 ENCOUNTER — Other Ambulatory Visit: Payer: Self-pay

## 2019-03-14 ENCOUNTER — Ambulatory Visit (HOSPITAL_COMMUNITY)
Admission: RE | Admit: 2019-03-14 | Discharge: 2019-03-14 | Disposition: A | Discharge status: Home / Self Care | Payer: Medicare Other | Source: Ambulatory Visit | Attending: Internal Medicine | Admitting: Internal Medicine

## 2019-03-14 ENCOUNTER — Other Ambulatory Visit (HOSPITAL_COMMUNITY): Payer: Self-pay | Admitting: Student

## 2019-03-14 DIAGNOSIS — Z7189 Other specified counseling: Secondary | ICD-10-CM | POA: Diagnosis not present

## 2019-03-14 DIAGNOSIS — E785 Hyperlipidemia, unspecified: Secondary | ICD-10-CM | POA: Diagnosis not present

## 2019-03-14 DIAGNOSIS — R52 Pain, unspecified: Secondary | ICD-10-CM

## 2019-03-14 DIAGNOSIS — I351 Nonrheumatic aortic (valve) insufficiency: Secondary | ICD-10-CM | POA: Diagnosis not present

## 2019-03-14 DIAGNOSIS — R5383 Other fatigue: Secondary | ICD-10-CM | POA: Diagnosis not present

## 2019-03-14 DIAGNOSIS — Z1339 Encounter for screening examination for other mental health and behavioral disorders: Secondary | ICD-10-CM | POA: Diagnosis not present

## 2019-03-14 DIAGNOSIS — Z299 Encounter for prophylactic measures, unspecified: Secondary | ICD-10-CM | POA: Diagnosis not present

## 2019-03-14 DIAGNOSIS — Z1331 Encounter for screening for depression: Secondary | ICD-10-CM | POA: Diagnosis not present

## 2019-03-14 DIAGNOSIS — Z1211 Encounter for screening for malignant neoplasm of colon: Secondary | ICD-10-CM | POA: Diagnosis not present

## 2019-03-14 DIAGNOSIS — C439 Malignant melanoma of skin, unspecified: Secondary | ICD-10-CM | POA: Diagnosis not present

## 2019-03-14 DIAGNOSIS — Z0000 Encounter for general adult medical examination without abnormal findings: Secondary | ICD-10-CM | POA: Diagnosis not present

## 2019-03-14 DIAGNOSIS — Z6827 Body mass index (BMI) 27.0-27.9, adult: Secondary | ICD-10-CM | POA: Diagnosis not present

## 2019-03-14 NOTE — Progress Notes (Signed)
LUE venous duplex       has been completed. Preliminary results can be found under CV proc through chart review. June Leap, BS, RDMS, RVT    Called negative results to McK[facility] at [facility] internal med office. Will let patient leave.

## 2019-03-17 ENCOUNTER — Encounter: Payer: Self-pay | Admitting: Cardiovascular Disease

## 2019-03-17 ENCOUNTER — Other Ambulatory Visit (HOSPITAL_COMMUNITY): Payer: Self-pay | Admitting: Ophthalmology

## 2019-03-17 DIAGNOSIS — G453 Amaurosis fugax: Secondary | ICD-10-CM

## 2019-03-17 DIAGNOSIS — Z79899 Other long term (current) drug therapy: Secondary | ICD-10-CM | POA: Diagnosis not present

## 2019-03-17 DIAGNOSIS — E785 Hyperlipidemia, unspecified: Secondary | ICD-10-CM | POA: Diagnosis not present

## 2019-03-17 DIAGNOSIS — R5383 Other fatigue: Secondary | ICD-10-CM | POA: Diagnosis not present

## 2019-03-18 ENCOUNTER — Other Ambulatory Visit: Payer: Self-pay

## 2019-03-18 ENCOUNTER — Ambulatory Visit (HOSPITAL_COMMUNITY)
Admission: RE | Admit: 2019-03-18 | Discharge: 2019-03-18 | Disposition: A | Discharge status: Home / Self Care | Payer: Medicare Other | Source: Ambulatory Visit | Attending: Ophthalmology | Admitting: Ophthalmology

## 2019-03-18 DIAGNOSIS — G453 Amaurosis fugax: Secondary | ICD-10-CM | POA: Diagnosis not present

## 2019-03-18 NOTE — Progress Notes (Signed)
Carotid artery duplex completed. Refer to "CV Proc" under chart review to view preliminary results.  03/18/2019 2:36 PM Maudry Mayhew, MHA, RVT, RDCS, RDMS

## 2019-03-20 ENCOUNTER — Other Ambulatory Visit: Payer: Self-pay

## 2019-03-20 ENCOUNTER — Encounter: Payer: Self-pay | Admitting: Cardiovascular Disease

## 2019-03-20 ENCOUNTER — Ambulatory Visit (INDEPENDENT_AMBULATORY_CARE_PROVIDER_SITE_OTHER): Payer: Medicare Other | Admitting: Nurse Practitioner

## 2019-03-20 ENCOUNTER — Encounter (INDEPENDENT_AMBULATORY_CARE_PROVIDER_SITE_OTHER): Payer: Self-pay | Admitting: Nurse Practitioner

## 2019-03-20 VITALS — BP 136/84 | HR 68 | Ht 63.0 in | Wt 153.0 lb

## 2019-03-20 DIAGNOSIS — R1310 Dysphagia, unspecified: Secondary | ICD-10-CM

## 2019-03-20 DIAGNOSIS — K219 Gastro-esophageal reflux disease without esophagitis: Secondary | ICD-10-CM | POA: Diagnosis not present

## 2019-03-20 DIAGNOSIS — R1319 Other dysphagia: Secondary | ICD-10-CM

## 2019-03-20 NOTE — Patient Instructions (Signed)
1. Schedule EGD   2. Continue Protonix 40mg  one capsule once daily  3. Further follow up to be determined after EGD completed

## 2019-03-20 NOTE — Progress Notes (Signed)
Subjective:    Patient ID: Angelica Rhodes, female    DOB: [birth year], 68 y.o.   MRN: [id number]  HPI Angelica Rhodes is a 68 year old female with a past medical history of arthritis, depression, aortic valve insufficiency, hypercholesterolemia, glaucoma, melanoma left hip 2008 and GERD. Past cholecystectomy. She presents today to schedule an EGD. She developed abrupt swelling to the left side of her neck and she presented to [facility] on 03/13/2019.  A CT angiogram of the neck identified a possible left jugular thrombosis. Edema to the left lower neck and supraclavicular area with scattered abnormal lymph nodes to this area and pericarinal mediastinal lymphadenopathy concerning for possible esophageal malignancy. A chest/abd/pelvic CT showed a contusion appearance to the left anterior chest, no hematoma, a small amount of fluid along the esophagus and a trace retroperitoneal fluid along the aorta, IVC and psoas musculature, small pleural effusions and bilateral thyroid nodules. She denies having any known chest trauma. She also had an episode of visual black out to her left eye, possible due to thrombotic emboli. She saw an ophthalmologist, it is unclear what her final diagnosis was but her right and left vision is currently intact. A carotid doppler study was done 03/18/2019 which showed RCA 1-39% stenosis. LCA 1-39% stenosis. Subclavian arteries with normal flow.  She complains of having  Indigestion, "hiccup burps" while she eats which lasts for a few minutes for the past 2 years. She took Nexium a few years ago for GERD but has not taken a PPI for the past year. She was seen in our office by Deberah Castle NP 05/24/2016 for dysphagia. An EGD was ordered but the patient canceled this procedure due to having flu symptoms. She started taking Protonix 40mg  once daily a few days ago as prescribed by Dr. Woody Seller. She vaguely describes having episodes of food getting stuck to the upper esophagus,  might occur once every few weeks. She passing a normal formed BM once daily, no rectal bleeding or black stools. She underwent a colonoscopy 05/31/2017 which identified a small polyp to the proximal sigmoid colon, diverticulosis and external hemorrhoids. One small tubular adenomatous polyp in the proximal sigmoid colon. Recall colonoscopy in 7 years recommended. No NSAID use. No alcohol. Past smoker. No fever, sweats or chills. No weight loss. No chest pain of SOB.  Past Medical History:  Diagnosis Date  . Arthritis   . Cataracts, bilateral   . Complication of anesthesia   . GERD (gastroesophageal reflux disease)    occ  . GERD (gastroesophageal reflux disease) 05/24/2016  . Glaucoma   . High cholesterol 05/24/2016  . Hyperlipidemia   . Leaky heart valve   . Mental disorder   . PONV (postoperative nausea and vomiting)    Past Surgical History:  Procedure Laterality Date  . BACK SURGERY     lower back  . CATARACT EXTRACTION W/PHACO Right 11/26/2013   Procedure: PHACO EMULSION CATARACT EXTRACTION WITH INTROCULAR LENS IMPLANTS RIGHT EYE;  Surgeon: [name], MD;  Location: [place];  Service: Ophthalmology;  Laterality: Right;  . CHOLECYSTECTOMY    . COLONOSCOPY N/A 05/31/2017   Procedure: COLONOSCOPY;  Surgeon: Rogene Houston, MD;  [facility];  Service: Endoscopy;  Laterality: N/A;  730  . TONSILLECTOMY    . TRABECULECTOMY Right 09/11/2012   Procedure: TRABECULECTOMY WITH MITOMYCIN RIGHT EYE WITH GLAUCOMA DEVICE;  Surgeon: [name], MD;  Location: [place];  Service: Ophthalmology;  Laterality: Right;  . TUBAL LIGATION  Current Outpatient Medications on File Prior to Visit  Medication Sig Dispense Refill  . brimonidine-timolol (COMBIGAN) 0.2-0.5 % ophthalmic solution Place 1 drop into the left eye every 12 (twelve) hours.    . dorzolamide (TRUSOPT) 2 % ophthalmic solution Place 1 drop 2 (two) times daily into the left eye.     Marland Kitchen ibuprofen (ADVIL,MOTRIN) 200 MG tablet  Take 200 mg every 8 (eight) hours as needed by mouth (for pain. (typically no more than once a week)).    Marland Kitchen pantoprazole (PROTONIX) 40 MG tablet 40 mg daily.      No current facility-administered medications on file prior to visit.   No Known Allergies  Family History  Problem Relation Age of Onset  . Hemochromatosis Maternal Grandmother   . Throat cancer Mother   . Alzheimer's disease Father    Social History   Socioeconomic History  . Marital status: Single    Spouse name: Not on file  . Number of children: Not on file  . Years of education: Not on file  . Highest education level: Not on file  Occupational History  . Not on file  Social Needs  . Financial resource strain: Not on file  . Food insecurity    Worry: Not on file    Inability: Not on file  . Transportation needs    Medical: Not on file    Non-medical: Not on file  Tobacco Use  . Smoking status: Former Smoker    Years: 25.00  . Smokeless tobacco: Never Used  . Tobacco comment: quit 2002  Substance and Sexual Activity  . Alcohol use: No  . Drug use: No  . Sexual activity: Not on file  Lifestyle  . Physical activity    Days per week: Not on file    Minutes per session: Not on file  . Stress: Not on file  Relationships  . Social Herbalist on phone: Not on file    Gets together: Not on file    Attends religious service: Not on file    Active member of club or organization: Not on file    Attends meetings of clubs or organizations: Not on file    Relationship status: Not on file  . Intimate partner violence    Fear of current or ex partner: Not on file    Emotionally abused: Not on file    Physically abused: Not on file    Forced sexual activity: Not on file  Other Topics Concern  . Not on file  Social History Narrative  . Not on file   Review of Systems See HPI, all other systems reviewed and are negative     Objective:   Physical Exam Blood pressure 136/84, pulse 68, height 5\' 3"   (1.6 m), weight 153 lb (69.4 kg).  General: 68 year old female well developed in NAD Eyes: sclera nonicteric, conjunctiva pink Mouth: no ulcers or lesions Neck: supple, no cervical lymphadenopathy, no palpable mass to left neck, small right thyroid nodule Heart: RRR, 1/6 systolic murmur Lungs: breath sounds clear throughout, no crackles or wheezes Abdomen: soft, nontender, + BS x 4 quads, no HSM Extremities: no edema Neuro: alert and oriented x 4, no focal deficts    Assessment & Plan:    1. 68 y.o. female with a hx of GERD, vague dysphagia with recent left jugular vein thrombosis with edema and abnormal lymph nodes to the left supraclavicular region per CT imaging concerning for possible esophageal malignancy -EGD  benefits and risk discussed with patient including risk with sedation, risk of bleeding, perforation and infection -If EGD negative, consider further evaluation to rule out thymoma in setting of left supraclavicular edema and mediastinal lymphadenopathy in the pericarnial region per neck CT angio, check mammogram if not done recently  -Further GI follow up to be determined after EGD completed  -Request labs and hospital admission/discharge summary from [facility]  2. History of colon polyps -recommend repeat colonoscopy 05/2022  3. Personal history of malignant melanoma left hip 2008  4. Thyroid nodules -follow up per PCP  5. Coronary calcifications to LAD per CT, Hx of aortic ? Insufficiency, ? Last ECHO. Retroperitoneal stranding around the aorta and IVC per chest CT. -cardiology follow up recommended   6. Bilateral pleural effusions per chest CT

## 2019-03-21 ENCOUNTER — Encounter (INDEPENDENT_AMBULATORY_CARE_PROVIDER_SITE_OTHER): Payer: Self-pay | Admitting: Nurse Practitioner

## 2019-03-25 NOTE — Patient Instructions (Signed)
Angelica Rhodes  03/25/2019     @PREFPERIOPPHARMACY @   Your procedure is scheduled on 04/02/2019.  Report to [facility] at 1;30 A.M.  Call this number if you have problems the morning of surgery:  [phone]   Remember:  Do not eat or drink after midnight.   Follow Dr Otelia Limes instructional sheet given to you at the office.    Take these medicines the morning of surgery with A SIP OF WATER Protonix    Do not wear jewelry, make-up or nail polish.  Do not wear lotions, powders, or perfumes, or deodorant.  Do not shave 48 hours prior to surgery.  Men may shave face and neck.  Do not bring valuables to the hospital.  [facility] is not responsible for any belongings or valuables.  Contacts, dentures or bridgework may not be worn into surgery.  Leave your suitcase in the car.  After surgery it may be brought to your room.  For patients admitted to the hospital, discharge time will be determined by your treatment team.  Patients discharged the day of surgery will not be allowed to drive home.   Name and phone number of your driver:   family Special instructions:  n/a  Please read over the following fact sheets that you were given. Care and Recovery After Surgery    Upper Endoscopy, Adult Upper endoscopy is a procedure to look inside the upper GI (gastrointestinal) tract. The upper GI tract is made up of:  The part of the body that moves food from your mouth to your stomach (esophagus).  The stomach.  The first part of your small intestine (duodenum). This procedure is also called esophagogastroduodenoscopy (EGD) or gastroscopy. In this procedure, your health care provider passes a thin, flexible tube (endoscope) through your mouth and down your esophagus into your stomach. A small camera is attached to the end of the tube. Images from the camera appear on a monitor in the exam room. During this procedure, your health care provider may also remove a small piece of tissue to be  sent to a lab and examined under a microscope (biopsy). Your health care provider may do an upper endoscopy to diagnose cancers of the upper GI tract. You may also have this procedure to find the cause of other conditions, such as:  Stomach pain.  Heartburn.  Pain or problems when swallowing.  Nausea and vomiting.  Stomach bleeding.  Stomach ulcers. Tell a health care provider about:  Any allergies you have.  All medicines you are taking, including vitamins, herbs, eye drops, creams, and over-the-counter medicines.  Any problems you or family members have had with anesthetic medicines.  Any blood disorders you have.  Any surgeries you have had.  Any medical conditions you have.  Whether you are pregnant or may be pregnant. What are the risks? Generally, this is a safe procedure. However, problems may occur, including:  Infection.  Bleeding.  Allergic reactions to medicines.  A tear or hole (perforation) in the esophagus, stomach, or duodenum. What happens before the procedure? Staying hydrated Follow instructions from your health care provider about hydration, which may include:  Up to 2 hours before the procedure - you may continue to drink clear liquids, such as water, clear fruit juice, black coffee, and plain tea.  Eating and drinking restrictions Follow instructions from your health care provider about eating and drinking, which may include:  8 hours before the procedure - stop eating heavy meals or foods, such  as meat, fried foods, or fatty foods.  6 hours before the procedure - stop eating light meals or foods, such as toast or cereal.  6 hours before the procedure - stop drinking milk or drinks that contain milk.  2 hours before the procedure - stop drinking clear liquids. Medicines Ask your health care provider about:  Changing or stopping your regular medicines. This is especially important if you are taking diabetes medicines or blood thinners.   Taking medicines such as aspirin and ibuprofen. These medicines can thin your blood. Do not take these medicines unless your health care provider tells you to take them.  Taking over-the-counter medicines, vitamins, herbs, and supplements. General instructions  Plan to have someone take you home from the hospital or clinic.  If you will be going home right after the procedure, plan to have someone with you for 24 hours.  Ask your health care provider what steps will be taken to help prevent infection. What happens during the procedure?   An IV will be inserted into one of your veins.  You may be given one or more of the following: ? A medicine to help you relax (sedative). ? A medicine to numb the throat (local anesthetic).  You will lie on your left side on an exam table.  Your health care provider will pass the endoscope through your mouth and down your esophagus.  Your health care provider will use the scope to check the inside of your esophagus, stomach, and duodenum. Biopsies may be taken.  The endoscope will be removed. The procedure may vary among health care providers and hospitals. What happens after the procedure?  Your blood pressure, heart rate, breathing rate, and blood oxygen level will be monitored until you leave the hospital or clinic.  Do not drive for 24 hours if you were given a sedative during your procedure.  When your throat is no longer numb, you may be given some fluids to drink.  It is up to you to get the results of your procedure. Ask your health care provider, or the department that is doing the procedure, when your results will be ready. Summary  Upper endoscopy is a procedure to look inside the upper GI tract.  During the procedure, an IV will be inserted into one of your veins. You may be given a medicine to help you relax.  A medicine will be used to numb your throat.  The endoscope will be passed through your mouth and down your esophagus.  This information is not intended to replace advice given to you by your health care provider. Make sure you discuss any questions you have with your health care provider. Document Released: 06/30/2000 Document Revised: 12/26/2017 Document Reviewed: 12/03/2017 Elsevier Patient Education  2020 [employer].

## 2019-03-26 ENCOUNTER — Other Ambulatory Visit (HOSPITAL_COMMUNITY)
Admission: RE | Admit: 2019-03-26 | Discharge: 2019-03-26 | Disposition: A | Discharge status: Home / Self Care | Payer: Medicare Other | Source: Ambulatory Visit | Attending: Internal Medicine | Admitting: Internal Medicine

## 2019-03-26 ENCOUNTER — Encounter (HOSPITAL_COMMUNITY)
Admission: RE | Admit: 2019-03-26 | Discharge: 2019-03-26 | Disposition: A | Discharge status: Home / Self Care | Payer: Medicare Other | Source: Ambulatory Visit | Attending: Internal Medicine | Admitting: Internal Medicine

## 2019-03-26 ENCOUNTER — Other Ambulatory Visit: Payer: Self-pay

## 2019-03-26 DIAGNOSIS — Z20828 Contact with and (suspected) exposure to other viral communicable diseases: Secondary | ICD-10-CM | POA: Insufficient documentation

## 2019-03-26 DIAGNOSIS — Z01812 Encounter for preprocedural laboratory examination: Secondary | ICD-10-CM | POA: Diagnosis not present

## 2019-03-26 DIAGNOSIS — K317 Polyp of stomach and duodenum: Secondary | ICD-10-CM | POA: Diagnosis not present

## 2019-03-26 LAB — SARS CORONAVIRUS 2 (TAT 6-24 HRS): SARS Coronavirus 2: NEGATIVE

## 2019-03-28 ENCOUNTER — Encounter (HOSPITAL_COMMUNITY): Payer: Self-pay | Admitting: *Deleted

## 2019-03-28 ENCOUNTER — Encounter (HOSPITAL_COMMUNITY)
Admission: RE | Disposition: A | Discharge status: Home / Self Care | Payer: Self-pay | Source: Home / Self Care | Attending: Internal Medicine

## 2019-03-28 ENCOUNTER — Ambulatory Visit (HOSPITAL_COMMUNITY): Payer: Medicare Other | Admitting: Anesthesiology

## 2019-03-28 ENCOUNTER — Ambulatory Visit (HOSPITAL_COMMUNITY)
Admission: RE | Admit: 2019-03-28 | Discharge: 2019-03-28 | Disposition: A | Discharge status: Home / Self Care | Payer: Medicare Other | Attending: Internal Medicine | Admitting: Internal Medicine

## 2019-03-28 DIAGNOSIS — R9389 Abnormal findings on diagnostic imaging of other specified body structures: Secondary | ICD-10-CM | POA: Insufficient documentation

## 2019-03-28 DIAGNOSIS — R933 Abnormal findings on diagnostic imaging of other parts of digestive tract: Secondary | ICD-10-CM | POA: Diagnosis not present

## 2019-03-28 DIAGNOSIS — K219 Gastro-esophageal reflux disease without esophagitis: Secondary | ICD-10-CM | POA: Diagnosis not present

## 2019-03-28 DIAGNOSIS — R1310 Dysphagia, unspecified: Secondary | ICD-10-CM | POA: Diagnosis not present

## 2019-03-28 DIAGNOSIS — Z87891 Personal history of nicotine dependence: Secondary | ICD-10-CM | POA: Insufficient documentation

## 2019-03-28 DIAGNOSIS — H409 Unspecified glaucoma: Secondary | ICD-10-CM | POA: Diagnosis not present

## 2019-03-28 DIAGNOSIS — Z85820 Personal history of malignant melanoma of skin: Secondary | ICD-10-CM | POA: Insufficient documentation

## 2019-03-28 DIAGNOSIS — K317 Polyp of stomach and duodenum: Secondary | ICD-10-CM | POA: Diagnosis not present

## 2019-03-28 DIAGNOSIS — R1319 Other dysphagia: Secondary | ICD-10-CM

## 2019-03-28 HISTORY — PX: BIOPSY: SHX5522

## 2019-03-28 HISTORY — PX: ESOPHAGOGASTRODUODENOSCOPY (EGD) WITH PROPOFOL: SHX5813

## 2019-03-28 SURGERY — ESOPHAGOGASTRODUODENOSCOPY (EGD) WITH PROPOFOL
Anesthesia: General

## 2019-03-28 MED ORDER — LIDOCAINE HCL (PF) 1 % IJ SOLN
INTRAMUSCULAR | Status: AC
  Filled 2019-03-28: qty 10

## 2019-03-28 MED ORDER — HYDROMORPHONE HCL 1 MG/ML IJ SOLN: 0.2500 mg | INTRAMUSCULAR | Status: DC | PRN

## 2019-03-28 MED ORDER — CHLORHEXIDINE GLUCONATE CLOTH 2 % EX PADS: 6.0000 | MEDICATED_PAD | Freq: Once | CUTANEOUS | Status: DC

## 2019-03-28 MED ORDER — PROMETHAZINE HCL 25 MG/ML IJ SOLN: 6.2500 mg | INTRAMUSCULAR | Status: DC | PRN

## 2019-03-28 MED ORDER — MIDAZOLAM HCL 2 MG/2ML IJ SOLN: 0.5000 mg | Freq: Once | INTRAMUSCULAR | Status: DC | PRN

## 2019-03-28 MED ORDER — KETAMINE HCL 50 MG/5ML IJ SOSY
PREFILLED_SYRINGE | INTRAMUSCULAR | Status: AC
  Filled 2019-03-28: qty 5

## 2019-03-28 MED ORDER — GLYCOPYRROLATE 0.2 MG/ML IJ SOLN
INTRAMUSCULAR | Status: DC | PRN
  Administered 2019-03-28: 0.2 mg via INTRAVENOUS

## 2019-03-28 MED ORDER — PROPOFOL 10 MG/ML IV BOLUS
INTRAVENOUS | Status: DC | PRN
  Administered 2019-03-28: 20 mg via INTRAVENOUS

## 2019-03-28 MED ORDER — HYDROCODONE-ACETAMINOPHEN 7.5-325 MG PO TABS: 1.0000 | ORAL_TABLET | Freq: Once | ORAL | Status: DC | PRN

## 2019-03-28 MED ORDER — PROPOFOL 500 MG/50ML IV EMUL
INTRAVENOUS | Status: DC | PRN
  Administered 2019-03-28: 150 ug/kg/min via INTRAVENOUS

## 2019-03-28 MED ORDER — LACTATED RINGERS IV SOLN
INTRAVENOUS | Status: DC
  Administered 2019-03-28: 1000 mL via INTRAVENOUS

## 2019-03-28 MED ORDER — LIDOCAINE HCL (CARDIAC) PF 100 MG/5ML IV SOSY
PREFILLED_SYRINGE | INTRAVENOUS | Status: DC | PRN
  Administered 2019-03-28: 40 mg via INTRATRACHEAL

## 2019-03-28 MED ORDER — KETAMINE HCL 10 MG/ML IJ SOLN
INTRAMUSCULAR | Status: DC | PRN
  Administered 2019-03-28: 20 mg via INTRAVENOUS

## 2019-03-28 NOTE — Transfer of Care (Signed)
Immediate Anesthesia Transfer of Care Note  Patient: Angelica Rhodes  Procedure(s) Performed: ESOPHAGOGASTRODUODENOSCOPY (EGD) WITH PROPOFOL (N/A ) BIOPSY  Patient Location: PACU  Anesthesia Type:General  Level of Consciousness: awake  Airway & Oxygen Therapy: Patient Spontanous Breathing  Post-op Assessment: Report given to RN and Post -op Vital signs reviewed and stable  Post vital signs: Reviewed and stable  Last Vitals:  Vitals Value Taken Time  BP 128/63 03/28/19 1434  Temp    Pulse 105 03/28/19 1435  Resp 12 03/28/19 1435  SpO2 98 % 03/28/19 1435  Vitals shown include unvalidated device data.  Last Pain:  Vitals:   03/28/19 1418  TempSrc:   PainSc: 1       Patients Stated Pain Goal: 5 ([id number])  Complications: No apparent anesthesia complications
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
## 2019-03-28 NOTE — Discharge Instructions (Signed)
No aspirin or NSAIDs for 24 hours. Resume usual medications and diet as before. No driving for 24 hours. Physician will call with biopsy results. Will plan to repeat chest CT next week.  Office will call.   Monitored Anesthesia Care, Care After These instructions provide you with information about caring for yourself after your procedure. Your health care provider may also give you more specific instructions. Your treatment has been planned according to current medical practices, but problems sometimes occur. Call your health care provider if you have any problems or questions after your procedure. What can I expect after the procedure? After your procedure, you may:  Feel sleepy for several hours.  Feel clumsy and have poor balance for several hours.  Feel forgetful about what happened after the procedure.  Have poor judgment for several hours.  Feel nauseous or vomit.  Have a sore throat if you had a breathing tube during the procedure. Follow these instructions at home: For at least 24 hours after the procedure:      Have a responsible adult stay with you. It is important to have someone help care for you until you are awake and alert.  Rest as needed.  Do not: ? Participate in activities in which you could fall or become injured. ? Drive. ? Use heavy machinery. ? Drink alcohol. ? Take sleeping pills or medicines that cause drowsiness. ? Make important decisions or sign legal documents. ? Take care of children on your own. Eating and drinking  Follow the diet that is recommended by your health care provider.  If you vomit, drink water, juice, or soup when you can drink without vomiting.  Make sure you have little or no nausea before eating solid foods. General instructions  Take over-the-counter and prescription medicines only as told by your health care provider.  If you have sleep apnea, surgery and certain medicines can increase your risk for breathing  problems. Follow instructions from your health care provider about wearing your sleep device: ? Anytime you are sleeping, including during daytime naps. ? While taking prescription pain medicines, sleeping medicines, or medicines that make you drowsy.  If you smoke, do not smoke without supervision.  Keep all follow-up visits as told by your health care provider. This is important. Contact a health care provider if:  You keep feeling nauseous or you keep vomiting.  You feel light-headed.  You develop a rash.  You have a fever. Get help right away if:  You have trouble breathing. Summary  For several hours after your procedure, you may feel sleepy and have poor judgment.  Have a responsible adult stay with you for at least 24 hours or until you are awake and alert. This information is not intended to replace advice given to you by your health care provider. Make sure you discuss any questions you have with your health care provider. Document Released: 10/24/2015 Document Revised: 10/01/2017 Document Reviewed: 10/24/2015 Elsevier Patient Education  2020 [employer].

## 2019-03-28 NOTE — Anesthesia Preprocedure Evaluation (Signed)
Anesthesia Evaluation  Patient identified by MRN, date of birth, ID band Patient awake    Reviewed: Allergy & Precautions, NPO status , Patient's Chart, lab work & pertinent test results  History of Anesthesia Complications (+) PONV  Airway Mallampati: II  TM Distance: >3 FB Neck ROM: Full    Dental no notable dental hx. (+) Teeth Intact   Pulmonary neg pulmonary ROS, former smoker,    Pulmonary exam normal breath sounds clear to auscultation       Cardiovascular Exercise Tolerance: Good negative cardio ROS Normal cardiovascular examI Rhythm:Regular Rate:Normal  Reports h/o leaky heart valve -uncertain which one  No cardiac studies avail in epic Reports good ET, denies CP/DOE, or any meds other than eye gtts and protonix Low risk procedure   Neuro/Psych negative neurological ROS  negative psych ROS   GI/Hepatic Neg liver ROS, GERD  Medicated and Controlled,  Endo/Other  negative endocrine ROS  Renal/GU negative Renal ROS  negative genitourinary   Musculoskeletal  (+) Arthritis , Osteoarthritis,    Abdominal   Peds negative pediatric ROS (+)  Hematology negative hematology ROS (+)   Anesthesia Other Findings   Reproductive/Obstetrics negative OB ROS                             Anesthesia Physical Anesthesia Plan  ASA: II  Anesthesia Plan: General   Post-op Pain Management:    Induction: Intravenous  PONV Risk Score and Plan: 3 and TIVA, Propofol infusion, Ondansetron and Treatment may vary due to age or medical condition  Airway Management Planned: Simple Face Mask and Nasal Cannula  Additional Equipment:   Intra-op Plan:   Post-operative Plan:   Informed Consent: I have reviewed the patients History and Physical, chart, labs and discussed the procedure including the risks, benefits and alternatives for the proposed anesthesia with the patient or authorized  representative who has indicated his/her understanding and acceptance.     Dental advisory given  Plan Discussed with: CRNA  Anesthesia Plan Comments: (Plan Full PPE use  Plan GA with GETA as needed d/w pt -WTP with same after Q&A)        Anesthesia Quick Evaluation

## 2019-03-28 NOTE — Anesthesia Postprocedure Evaluation (Signed)
Anesthesia Post Note  Patient: Angelica Rhodes  Procedure(s) Performed: ESOPHAGOGASTRODUODENOSCOPY (EGD) WITH PROPOFOL (N/A ) BIOPSY  Patient location during evaluation: PACU Anesthesia Type: General Level of consciousness: awake Pain management: pain level controlled Vital Signs Assessment: post-procedure vital signs reviewed and stable Cardiovascular status: stable Postop Assessment: no apparent nausea or vomiting Anesthetic complications: no     Last Vitals:  Vitals:   03/28/19 1400 03/28/19 1435  BP: (!) 150/89 128/63  Pulse: 83 71  Resp: 16 16  Temp: 37 C (P) 36.8 C  SpO2: 97%     Last Pain:  Vitals:   03/28/19 1418  TempSrc:   PainSc: [place]
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
## 2019-03-28 NOTE — Anesthesia Procedure Notes (Signed)
Procedure Name: General with mask airway Date/Time: 03/28/2019 2:16 PM Performed by: Andree Elk, Amy A, CRNA Pre-anesthesia Checklist: Timeout performed, Patient being monitored, Suction available, Patient identified and Emergency Drugs available Patient Re-evaluated:Patient Re-evaluated prior to induction Oxygen Delivery Method: Non-rebreather mask

## 2019-03-31 ENCOUNTER — Other Ambulatory Visit (INDEPENDENT_AMBULATORY_CARE_PROVIDER_SITE_OTHER): Payer: Self-pay | Admitting: *Deleted

## 2019-03-31 DIAGNOSIS — J90 Pleural effusion, not elsewhere classified: Secondary | ICD-10-CM

## 2019-04-02 ENCOUNTER — Encounter (HOSPITAL_COMMUNITY): Payer: Self-pay | Admitting: Internal Medicine

## 2019-04-02 DIAGNOSIS — J90 Pleural effusion, not elsewhere classified: Secondary | ICD-10-CM | POA: Diagnosis not present

## 2019-04-02 DIAGNOSIS — Z299 Encounter for prophylactic measures, unspecified: Secondary | ICD-10-CM | POA: Diagnosis not present

## 2019-04-02 DIAGNOSIS — I700 Atherosclerosis of aorta: Secondary | ICD-10-CM | POA: Diagnosis not present

## 2019-04-02 DIAGNOSIS — C439 Malignant melanoma of skin, unspecified: Secondary | ICD-10-CM | POA: Diagnosis not present

## 2019-04-02 DIAGNOSIS — Z6827 Body mass index (BMI) 27.0-27.9, adult: Secondary | ICD-10-CM | POA: Diagnosis not present

## 2019-04-02 DIAGNOSIS — I10 Essential (primary) hypertension: Secondary | ICD-10-CM | POA: Diagnosis not present

## 2019-04-14 ENCOUNTER — Other Ambulatory Visit: Payer: Self-pay

## 2019-04-14 ENCOUNTER — Ambulatory Visit (HOSPITAL_COMMUNITY)
Admission: RE | Admit: 2019-04-14 | Discharge: 2019-04-14 | Disposition: A | Discharge status: Home / Self Care | Payer: Medicare Other | Source: Ambulatory Visit | Attending: Internal Medicine | Admitting: Internal Medicine

## 2019-04-14 DIAGNOSIS — R590 Localized enlarged lymph nodes: Secondary | ICD-10-CM | POA: Diagnosis not present

## 2019-04-14 DIAGNOSIS — J90 Pleural effusion, not elsewhere classified: Secondary | ICD-10-CM | POA: Insufficient documentation

## 2019-04-14 LAB — POCT I-STAT CREATININE: Creatinine, Ser: 0.8 mg/dL (ref 0.44–1.00)

## 2019-04-14 MED ORDER — IOHEXOL 300 MG/ML  SOLN
75.0000 mL | Freq: Once | INTRAMUSCULAR | Status: AC | PRN
  Administered 2019-04-14: 15:00:00 75 mL via INTRAVENOUS

## 2019-04-17 DIAGNOSIS — I351 Nonrheumatic aortic (valve) insufficiency: Secondary | ICD-10-CM | POA: Diagnosis not present

## 2019-04-17 DIAGNOSIS — Z6827 Body mass index (BMI) 27.0-27.9, adult: Secondary | ICD-10-CM | POA: Diagnosis not present

## 2019-04-17 DIAGNOSIS — Z299 Encounter for prophylactic measures, unspecified: Secondary | ICD-10-CM | POA: Diagnosis not present

## 2019-04-17 DIAGNOSIS — C439 Malignant melanoma of skin, unspecified: Secondary | ICD-10-CM | POA: Diagnosis not present

## 2019-04-17 DIAGNOSIS — I700 Atherosclerosis of aorta: Secondary | ICD-10-CM | POA: Diagnosis not present

## 2019-04-17 DIAGNOSIS — I10 Essential (primary) hypertension: Secondary | ICD-10-CM | POA: Diagnosis not present

## 2019-04-17 DIAGNOSIS — E041 Nontoxic single thyroid nodule: Secondary | ICD-10-CM | POA: Diagnosis not present

## 2019-04-17 DIAGNOSIS — Z23 Encounter for immunization: Secondary | ICD-10-CM | POA: Diagnosis not present

## 2019-04-18 ENCOUNTER — Encounter: Payer: Self-pay | Admitting: *Deleted

## 2019-04-21 ENCOUNTER — Ambulatory Visit (INDEPENDENT_AMBULATORY_CARE_PROVIDER_SITE_OTHER): Payer: Medicare Other | Admitting: Cardiovascular Disease

## 2019-04-21 ENCOUNTER — Telehealth: Payer: Self-pay | Admitting: Cardiovascular Disease

## 2019-04-21 ENCOUNTER — Other Ambulatory Visit: Payer: Self-pay

## 2019-04-21 ENCOUNTER — Encounter: Payer: Self-pay | Admitting: Cardiovascular Disease

## 2019-04-21 VITALS — BP 128/70 | HR 69 | Ht 63.0 in | Wt 146.0 lb

## 2019-04-21 DIAGNOSIS — I10 Essential (primary) hypertension: Secondary | ICD-10-CM

## 2019-04-21 DIAGNOSIS — I38 Endocarditis, valve unspecified: Secondary | ICD-10-CM

## 2019-04-21 DIAGNOSIS — E042 Nontoxic multinodular goiter: Secondary | ICD-10-CM | POA: Diagnosis not present

## 2019-04-21 DIAGNOSIS — E041 Nontoxic single thyroid nodule: Secondary | ICD-10-CM | POA: Diagnosis not present

## 2019-04-21 DIAGNOSIS — K219 Gastro-esophageal reflux disease without esophagitis: Secondary | ICD-10-CM

## 2019-04-21 DIAGNOSIS — R002 Palpitations: Secondary | ICD-10-CM

## 2019-04-21 NOTE — Patient Instructions (Signed)
Medication Instructions:  Continue all current medications.  Labwork: none  Testing/Procedures:  Your physician has requested that you have an echocardiogram. Echocardiography is a painless test that uses sound waves to create images of your heart. It provides your doctor with information about the size and shape of your heart and how well your heart's chambers and valves are working. This procedure takes approximately one hour. There are no restrictions for this procedure.  Your physician has recommended that you wear a 1 weeks event monitor. Event monitors are medical devices that record the heart's electrical activity. Doctors most often [place] these monitors to diagnose arrhythmias. Arrhythmias are problems with the speed or rhythm of the heartbeat. The monitor is a small, portable device. You can wear one while you do your normal daily activities. This is usually used to diagnose what is causing palpitations/syncope (passing out).  Office will contact with results via phone or letter.    Follow-Up: 3 months   Any Other Special Instructions Will Be Listed Below (If Applicable).  If you need a refill on your cardiac medications before your next appointment, please call your pharmacy.

## 2019-04-21 NOTE — Progress Notes (Signed)
CARDIOLOGY CONSULT NOTE  Patient ID: Angelica Rhodes MRN: [id number] DOB/AGE: 1951-01-10 68 y.o.  Admit date: (Not on file) Primary Physician: Glenda Chroman, MD  Reason for Consultation: Palpitations  HPI: Angelica Rhodes is a 68 y.o. female who is being seen today for the evaluation of palpitations at the request of Vyas, Dhruv B, MD.   I reviewed the chest CT performed on 04/14/2019 which demonstrated atherosclerotic calcification of the aorta and coronary arteries.  Carotid Dopplers on 03/18/2019 demonstrated 1 to 39% bilateral ICA stenosis.  She currently denies chest pain.  She rarely has shortness of breath.  She has been experiencing palpitations for several years.  Her heart can feel like it is racing when she is lying down watching TV.  She lost vision in her left eye about a month ago.  It returned spontaneously.  She told me she has a "leaky heart valve "and her most recent echocardiogram was reportedly in 2018 performed at her PCPs office.  She reportedly had a nuclear stress test in 2015 at [facility].  She denies leg swelling, orthopnea, and paroxysmal nocturnal dyspnea.   No Known Allergies  Current Outpatient Medications  Medication Sig Dispense Refill  . brimonidine-timolol (COMBIGAN) 0.2-0.5 % ophthalmic solution Place 1 drop into the left eye every 12 (twelve) hours.    . dorzolamide (TRUSOPT) 2 % ophthalmic solution Place 1 drop 2 (two) times daily into the left eye.     Marland Kitchen ibuprofen (ADVIL,MOTRIN) 200 MG tablet Take 200 mg every 8 (eight) hours as needed by mouth (for pain. (typically no more than once a week)).    Marland Kitchen metoprolol succinate (TOPROL-XL) 25 MG 24 hr tablet Take 25 mg by mouth daily.    . Multiple Vitamin (MULTIVITAMIN WITH MINERALS) TABS tablet Take 1 tablet by mouth 4 (four) times a week. ONE-A-DAY (IN THE MORNING)    . pantoprazole (PROTONIX) 40 MG tablet Take 40 mg by mouth every morning.      No current facility-administered  medications for this visit.     Past Medical History:  Diagnosis Date  . Arthritis   . Cataracts, bilateral   . Complication of anesthesia   . GERD (gastroesophageal reflux disease)    occ  . GERD (gastroesophageal reflux disease) 05/24/2016  . Glaucoma   . High cholesterol 05/24/2016  . Hyperlipidemia   . Leaky heart valve   . Melanoma ([place])    Left Hip 2008  . Mental disorder   . PONV (postoperative nausea and vomiting)     Past Surgical History:  Procedure Laterality Date  . BACK SURGERY     lower back  . BIOPSY  03/28/2019   Procedure: BIOPSY;  Surgeon: Rogene Houston, MD;  [facility];  Service: Endoscopy;;  . CATARACT EXTRACTION W/PHACO Right 11/26/2013   Procedure: PHACO EMULSION CATARACT EXTRACTION WITH INTROCULAR LENS IMPLANTS RIGHT EYE;  Surgeon: [name], MD;  Location: [place];  Service: Ophthalmology;  Laterality: Right;  . CHOLECYSTECTOMY    . COLONOSCOPY N/A 05/31/2017   Procedure: COLONOSCOPY;  Surgeon: Rogene Houston, MD;  [facility];  Service: Endoscopy;  Laterality: N/A;  730  . ESOPHAGOGASTRODUODENOSCOPY (EGD) WITH PROPOFOL N/A 03/28/2019   Procedure: ESOPHAGOGASTRODUODENOSCOPY (EGD) WITH PROPOFOL;  Surgeon: Rogene Houston, MD;  [facility];  Service: Endoscopy;  Laterality: N/A;  2:55 - ok per Threasa Beards  . TONSILLECTOMY    . TRABECULECTOMY Right 09/11/2012   Procedure:  TRABECULECTOMY WITH MITOMYCIN RIGHT EYE WITH GLAUCOMA DEVICE;  Surgeon: [name], MD;  Location: [place];  Service: Ophthalmology;  Laterality: Right;  . TUBAL LIGATION      Social History   Socioeconomic History  . Marital status: Single    Spouse name: Not on file  . Number of children: Not on file  . Years of education: Not on file  . Highest education level: Not on file  Occupational History  . Not on file  Social Needs  . Financial resource strain: Not on file  . Food insecurity    Worry: Not on file    Inability: Not on file  .  Transportation needs    Medical: Not on file    Non-medical: Not on file  Tobacco Use  . Smoking status: Former Smoker    Years: 25.00  . Smokeless tobacco: Never Used  . Tobacco comment: quit 2002  Substance and Sexual Activity  . Alcohol use: No  . Drug use: No  . Sexual activity: Not on file  Lifestyle  . Physical activity    Days per week: Not on file    Minutes per session: Not on file  . Stress: Not on file  Relationships  . Social Herbalist on phone: Not on file    Gets together: Not on file    Attends religious service: Not on file    Active member of club or organization: Not on file    Attends meetings of clubs or organizations: Not on file    Relationship status: Not on file  . Intimate partner violence    Fear of current or ex partner: Not on file    Emotionally abused: Not on file    Physically abused: Not on file    Forced sexual activity: Not on file  Other Topics Concern  . Not on file  Social History Narrative  . Not on file     No family history of premature CAD in 1st degree relatives.  Current Meds  Medication Sig  . brimonidine-timolol (COMBIGAN) 0.2-0.5 % ophthalmic solution Place 1 drop into the left eye every 12 (twelve) hours.  . dorzolamide (TRUSOPT) 2 % ophthalmic solution Place 1 drop 2 (two) times daily into the left eye.   Marland Kitchen ibuprofen (ADVIL,MOTRIN) 200 MG tablet Take 200 mg every 8 (eight) hours as needed by mouth (for pain. (typically no more than once a week)).  Marland Kitchen metoprolol succinate (TOPROL-XL) 25 MG 24 hr tablet Take 25 mg by mouth daily.  . Multiple Vitamin (MULTIVITAMIN WITH MINERALS) TABS tablet Take 1 tablet by mouth 4 (four) times a week. ONE-A-DAY (IN THE MORNING)  . pantoprazole (PROTONIX) 40 MG tablet Take 40 mg by mouth every morning.       Review of systems complete and found to be negative unless listed above in HPI    Physical exam Height 5\' 3"  (1.6 m), weight 146 lb (66.2 kg). General: NAD Neck: No  JVD, no thyromegaly or thyroid nodule.  Lungs: Clear to auscultation bilaterally with normal respiratory effort. CV: Nondisplaced PMI. Regular rate and rhythm, normal S1/S2, no S3/S4, no murmur.  No peripheral edema.  No carotid bruit.    Abdomen: Soft, nontender, no distention.  Skin: Intact without lesions or rashes.  Neurologic: Alert and oriented x 3.  Psych: Normal affect. Extremities: No clubbing or cyanosis.  HEENT: Normal.   ECG: Most recent ECG reviewed.   Labs: Lab Results  Component Value Date/Time  K 4.3 11/26/2013 08:13 AM   BUN 17 11/26/2013 08:13 AM   CREATININE 0.80 04/14/2019 02:41 PM   HGB 13.4 11/26/2013 08:13 AM     Lipids: No results found for: LDLCALC, LDLDIRECT, CHOL, TRIG, HDL      ASSESSMENT AND PLAN:  1.  Palpitations: I will obtain a 1 week event monitor.  I will obtain an echocardiogram to evaluate cardiac structure and function.  2.  Hypertension: Controlled on Toprol-XL 25 mg daily.  No changes.  3.  GERD: Currently on Protonix 40 mg daily.     Disposition: Follow up in 3 months  Signed: Kate Sable, M.D., F.A.C.C.  04/21/2019, 1:39 PM

## 2019-04-21 NOTE — Telephone Encounter (Signed)
Pre-cert Verification for the following procedure    Event monitor Cupid

## 2019-04-22 ENCOUNTER — Encounter: Payer: Self-pay | Admitting: *Deleted

## 2019-04-30 ENCOUNTER — Telehealth: Payer: Self-pay | Admitting: Neurology

## 2019-04-30 ENCOUNTER — Other Ambulatory Visit: Payer: Medicare Other

## 2019-04-30 NOTE — Telephone Encounter (Signed)
Patient called with questions about her heart monitor.

## 2019-04-30 NOTE — Telephone Encounter (Signed)
Contacted patient about her monitor. Patient wanted to know how many days she needed to wear the monitor. Advised that per her instruction sheet given to her, it is for one week (7 days). Patient says that she is going out of town this weekend and will return on Monday (05/05/19). Patient request that we help her put her monitor on when she comes to the office for her echo on 05/06/19. Advised patient that she should contact the monitor company directly for assistance and if she is not able to get her monitor activated that we would be happy to assist her when she is in the office next week. Verbalized understanding.

## 2019-05-05 ENCOUNTER — Other Ambulatory Visit: Payer: Medicare Other

## 2019-05-06 ENCOUNTER — Ambulatory Visit (INDEPENDENT_AMBULATORY_CARE_PROVIDER_SITE_OTHER): Payer: Medicare Other

## 2019-05-06 ENCOUNTER — Other Ambulatory Visit: Payer: Medicare Other

## 2019-05-06 DIAGNOSIS — L814 Other melanin hyperpigmentation: Secondary | ICD-10-CM | POA: Diagnosis not present

## 2019-05-06 DIAGNOSIS — Z23 Encounter for immunization: Secondary | ICD-10-CM | POA: Diagnosis not present

## 2019-05-06 DIAGNOSIS — L570 Actinic keratosis: Secondary | ICD-10-CM | POA: Diagnosis not present

## 2019-05-06 DIAGNOSIS — R002 Palpitations: Secondary | ICD-10-CM

## 2019-05-06 DIAGNOSIS — L820 Inflamed seborrheic keratosis: Secondary | ICD-10-CM | POA: Diagnosis not present

## 2019-05-06 DIAGNOSIS — L821 Other seborrheic keratosis: Secondary | ICD-10-CM | POA: Diagnosis not present

## 2019-05-06 DIAGNOSIS — D485 Neoplasm of uncertain behavior of skin: Secondary | ICD-10-CM | POA: Diagnosis not present

## 2019-05-06 DIAGNOSIS — Z85820 Personal history of malignant melanoma of skin: Secondary | ICD-10-CM | POA: Diagnosis not present

## 2019-05-06 DIAGNOSIS — D225 Melanocytic nevi of trunk: Secondary | ICD-10-CM | POA: Diagnosis not present

## 2019-05-13 ENCOUNTER — Ambulatory Visit (INDEPENDENT_AMBULATORY_CARE_PROVIDER_SITE_OTHER): Payer: Medicare Other

## 2019-05-13 ENCOUNTER — Other Ambulatory Visit: Payer: Self-pay

## 2019-05-13 DIAGNOSIS — I38 Endocarditis, valve unspecified: Secondary | ICD-10-CM | POA: Diagnosis not present

## 2019-05-15 ENCOUNTER — Telehealth: Payer: Self-pay | Admitting: *Deleted

## 2019-05-15 NOTE — Telephone Encounter (Signed)
Notes recorded by Laurine Blazer, LPN on [id number] at 5:51 PM EDT  Patient notified. Copy to pmd. Follow up scheduled for 08/04/2019 with Dr. Bronson Ing.    ------   Notes recorded by Herminio Commons, MD on 05/13/2019 at 3:16 PM EDT  Normal pumping function. Mild aortic valve leakage.

## 2019-05-27 DIAGNOSIS — H2512 Age-related nuclear cataract, left eye: Secondary | ICD-10-CM | POA: Diagnosis not present

## 2019-05-27 DIAGNOSIS — Z961 Presence of intraocular lens: Secondary | ICD-10-CM | POA: Diagnosis not present

## 2019-05-27 DIAGNOSIS — G43B0 Ophthalmoplegic migraine, not intractable: Secondary | ICD-10-CM | POA: Diagnosis not present

## 2019-05-27 DIAGNOSIS — H401432 Capsular glaucoma with pseudoexfoliation of lens, bilateral, moderate stage: Secondary | ICD-10-CM | POA: Diagnosis not present

## 2019-05-27 DIAGNOSIS — G453 Amaurosis fugax: Secondary | ICD-10-CM | POA: Diagnosis not present

## 2019-05-27 DIAGNOSIS — H43811 Vitreous degeneration, right eye: Secondary | ICD-10-CM | POA: Diagnosis not present

## 2019-05-29 ENCOUNTER — Telehealth: Payer: Self-pay | Admitting: *Deleted

## 2019-05-29 NOTE — Telephone Encounter (Signed)
Notes recorded by Laurine Blazer, LPN on [id number] at 5:36 PM EST  Patient notified. Copy to pmd. Follow up scheduled for 08/04/2019.    ------   Notes recorded by Herminio Commons, MD on 05/28/2019 at 4:50 PM EST  No significant arrhythmias.

## 2019-06-03 ENCOUNTER — Other Ambulatory Visit: Payer: Self-pay

## 2019-06-03 ENCOUNTER — Encounter: Payer: Self-pay | Admitting: Internal Medicine

## 2019-06-03 ENCOUNTER — Ambulatory Visit (INDEPENDENT_AMBULATORY_CARE_PROVIDER_SITE_OTHER): Payer: Medicare Other | Admitting: Internal Medicine

## 2019-06-03 DIAGNOSIS — R9389 Abnormal findings on diagnostic imaging of other specified body structures: Secondary | ICD-10-CM | POA: Diagnosis not present

## 2019-06-03 NOTE — Progress Notes (Signed)
Angelica Rhodes    [id number]    Aug 05, 1950  Primary Care Physician:Vyas, Costella Hatcher, MD  Referring Physician: Glenda Chroman, MD [place],  [place] [id number] Reason for Consultation: Pleural effusion Date of Consultation: 06/03/2019  Chief complaint:   Chief Complaint  Patient presents with  . Consult    Referred by PCP for pleural effusion.  CT chest in Epic from 03/2019.       HPI: Patient is a 68 year old woman from [facility] who presents in consultation from Dr. Woody Seller for evaluation of pleural effusion.  In August of this year she developed neck swelling on her left side and reported to the ED for further evaluation.  She had a CT chest abdomen and pelvis as well as neck which was concerning for a possible left internal internal jugular vein thrombus, as well as some reactive mediastinal lymph adenopathy.  She had trace bilateral pleural effusions as well.  She had extensive work-up for malignancy including EGD.  With all been negative.  She had a repeat CT scan done to follow-up on the pleural effusions and lymphadenopathy in September and the effusion is since resolved.  She currently denies any symptoms of dyspnea cough and other than hot flashes feels well.  She also has arthritis in both of her hands which bothers her.    Social history: Pets: used to have a dog, died recently Occupation:  Retired from [employer] alone at home Smoking history: smoked 30 pack years - quit 2005 Lifelong resident of: [place], [place].    Relevant family history: No family history of lung disease Mother - throat cancer - never smoker, no smokeless tobacco.   Family History  Problem Relation Age of Onset  . Hemochromatosis Maternal Grandmother   . Throat cancer Mother   . Alzheimer's disease Father     Past Medical History:  Diagnosis Date  . Arthritis   . Cataracts, bilateral   . Complication of anesthesia   . GERD (gastroesophageal reflux disease)    occ   . GERD (gastroesophageal reflux disease) 05/24/2016  . Glaucoma   . High cholesterol 05/24/2016  . Hyperlipidemia   . Leaky heart valve   . Melanoma ([place])    Left Hip 2008  . Mental disorder   . PONV (postoperative nausea and vomiting)     Past Surgical History:  Procedure Laterality Date  . BACK SURGERY     lower back  . BIOPSY  03/28/2019   Procedure: BIOPSY;  Surgeon: Rogene Houston, MD;  [facility];  Service: Endoscopy;;  . CATARACT EXTRACTION W/PHACO Right 11/26/2013   Procedure: PHACO EMULSION CATARACT EXTRACTION WITH INTROCULAR LENS IMPLANTS RIGHT EYE;  Surgeon: [name], MD;  Location: [place];  Service: Ophthalmology;  Laterality: Right;  . CHOLECYSTECTOMY    . COLONOSCOPY N/A 05/31/2017   Procedure: COLONOSCOPY;  Surgeon: Rogene Houston, MD;  [facility];  Service: Endoscopy;  Laterality: N/A;  730  . ESOPHAGOGASTRODUODENOSCOPY (EGD) WITH PROPOFOL N/A 03/28/2019   Procedure: ESOPHAGOGASTRODUODENOSCOPY (EGD) WITH PROPOFOL;  Surgeon: Rogene Houston, MD;  [facility];  Service: Endoscopy;  Laterality: N/A;  2:55 - ok per Threasa Beards  . TONSILLECTOMY    . TRABECULECTOMY Right 09/11/2012   Procedure: TRABECULECTOMY WITH MITOMYCIN RIGHT EYE WITH GLAUCOMA DEVICE;  Surgeon: [name], MD;  Location: [place];  Service: Ophthalmology;  Laterality: Right;  . TUBAL LIGATION  Review of systems: Constitutional: Negative for fever and chills, sweats, and weight loss HENT: Rhinitis.   Eyes: Negative for blurred vision.  Respiratory: No dyspnea.  No wheezing no recurrent pneumonia Cardiovascular: Negative for chest pain and palpitations.  Gastrointestinal: No dysphagia negative for vomiting, diarrhea, blood per rectum. Genitourinary: Negative for dysuria, urgency, frequency and hematuria.  Musculoskeletal: Bilateral hand osteoarthritis, back pain Skin: Negative for itching and rash.  Neurological: Negative for dizziness, tremors, focal  weakness, seizures and loss of consciousness.  Endo/Heme/Allergies: Negative for environmental allergies.  Psychiatric/Behavioral: Negative for depression, suicidal ideas and hallucinations.  All other systems reviewed and are negative.  Physical Exam: Blood pressure 132/72, pulse 73, height 5\' 3"  (1.6 m), weight 150 lb 6.4 oz (68.2 kg), SpO2 99 %. Gen:      No acute distress Eyes: EOMI, sclera anicteric ENT:  no nasal polyps, mucus membranes moist Neck:     Supple, no thyromegaly Lungs:    No increased respiratory effort, symmetric chest wall excursion, clear to auscultation bilaterally, no wheezes or crackles CV:         Regular rate and rhythm; no murmurs, rubs, or gallops.  No pedal edema Abd:      + bowel sounds; soft, non-tender; no distension MSK: no acute synovitis of DIP or PIP joints, no mechanics hands.  Skin:      Warm and dry; no rashes Neuro: normal speech, no focal facial asymmetry Psych: alert and oriented x3, normal mood and affect  Data Reviewed: Imaging: I have personally reviewed the side hospital records including a CT scan report performed in August 2020.  The CT chest abdomen pelvis does demonstrate significant soft tissue swelling consistent with trauma.  There is mild reactive lymphadenopathy.  There is also report of trace bilateral pleural effusions and associated atelectasis.  PFTs:  No documented PFTs  Labs:  Immunization status: Immunization History  Administered Date(s) Administered  . Fluad Quad(high Dose 65+) 04/17/2019  . Zoster Recombinat (Shingrix) 05/03/2017   up to date and documented.  Assessment:  Abnormal CT chest  Plan/Recommendations: Ms. Maltbie had report of pleural effusions after a trauma.  On subsequent CT chest these have resolved.  No further follow-up is needed for these effusions.  I am happy to see her back in the future should the clinical situation change.  Return to Care: Return if symptoms worsen or fail to improve.   Lenice Llamas, MD Pulmonary and [place]  CC: Glenda Chroman, MD

## 2019-06-16 DIAGNOSIS — H6091 Unspecified otitis externa, right ear: Secondary | ICD-10-CM | POA: Diagnosis not present

## 2019-06-16 DIAGNOSIS — Z87891 Personal history of nicotine dependence: Secondary | ICD-10-CM | POA: Diagnosis not present

## 2019-06-16 DIAGNOSIS — Z299 Encounter for prophylactic measures, unspecified: Secondary | ICD-10-CM | POA: Diagnosis not present

## 2019-06-16 DIAGNOSIS — Z6826 Body mass index (BMI) 26.0-26.9, adult: Secondary | ICD-10-CM | POA: Diagnosis not present

## 2019-06-16 DIAGNOSIS — C439 Malignant melanoma of skin, unspecified: Secondary | ICD-10-CM | POA: Diagnosis not present

## 2019-06-16 DIAGNOSIS — H6981 Other specified disorders of Eustachian tube, right ear: Secondary | ICD-10-CM | POA: Diagnosis not present

## 2019-06-16 DIAGNOSIS — I700 Atherosclerosis of aorta: Secondary | ICD-10-CM | POA: Diagnosis not present

## 2019-07-24 DIAGNOSIS — I351 Nonrheumatic aortic (valve) insufficiency: Secondary | ICD-10-CM | POA: Diagnosis not present

## 2019-07-24 DIAGNOSIS — C439 Malignant melanoma of skin, unspecified: Secondary | ICD-10-CM | POA: Diagnosis not present

## 2019-07-24 DIAGNOSIS — E785 Hyperlipidemia, unspecified: Secondary | ICD-10-CM | POA: Diagnosis not present

## 2019-07-24 DIAGNOSIS — I10 Essential (primary) hypertension: Secondary | ICD-10-CM | POA: Diagnosis not present

## 2019-07-24 DIAGNOSIS — Z6827 Body mass index (BMI) 27.0-27.9, adult: Secondary | ICD-10-CM | POA: Diagnosis not present

## 2019-07-24 DIAGNOSIS — M1990 Unspecified osteoarthritis, unspecified site: Secondary | ICD-10-CM | POA: Diagnosis not present

## 2019-07-24 DIAGNOSIS — Z299 Encounter for prophylactic measures, unspecified: Secondary | ICD-10-CM | POA: Diagnosis not present

## 2019-07-28 ENCOUNTER — Other Ambulatory Visit: Payer: Self-pay

## 2019-07-28 ENCOUNTER — Encounter: Payer: Self-pay | Admitting: Cardiovascular Disease

## 2019-07-28 ENCOUNTER — Ambulatory Visit (INDEPENDENT_AMBULATORY_CARE_PROVIDER_SITE_OTHER): Payer: Medicare Other | Admitting: Cardiovascular Disease

## 2019-07-28 VITALS — BP 122/80 | HR 78 | Ht 63.0 in | Wt 154.0 lb

## 2019-07-28 DIAGNOSIS — R002 Palpitations: Secondary | ICD-10-CM | POA: Diagnosis not present

## 2019-07-28 DIAGNOSIS — I10 Essential (primary) hypertension: Secondary | ICD-10-CM | POA: Diagnosis not present

## 2019-07-28 DIAGNOSIS — K219 Gastro-esophageal reflux disease without esophagitis: Secondary | ICD-10-CM

## 2019-07-28 NOTE — Progress Notes (Signed)
SUBJECTIVE: The patient returns for follow-up after undergoing cardiovascular testing performed for the evaluation of palpitations.  Echocardiogram on 05/13/2019 demonstrated normal LV systolic function, EF 60 to 65%, with mild aortic regurgitation.  Event monitoring demonstrated sinus rhythm and sinus tachycardia.  There were no significant arrhythmias.  She denies exertional chest pain or dyspnea.  Palpitations are seldom in occurrence and appear to be chronic.  She denies leg swelling, orthopnea, paroxysmal nocturnal dyspnea.  She has questions about coming off of her metoprolol which her PCP prescribed for hypertension.   Review of Systems: As per "subjective", otherwise negative.  No Known Allergies  Current Outpatient Medications  Medication Sig Dispense Refill  . brimonidine-timolol (COMBIGAN) 0.2-0.5 % ophthalmic solution Place 1 drop into the left eye every 12 (twelve) hours.    . dorzolamide (TRUSOPT) 2 % ophthalmic solution Place 1 drop 2 (two) times daily into the left eye.     Marland Kitchen ibuprofen (ADVIL,MOTRIN) 200 MG tablet Take 200 mg every 8 (eight) hours as needed by mouth (for pain. (typically no more than once a week)).    Marland Kitchen metoprolol succinate (TOPROL-XL) 25 MG 24 hr tablet Take 25 mg by mouth daily.    . Multiple Vitamin (MULTIVITAMIN WITH MINERALS) TABS tablet Take 1 tablet by mouth 4 (four) times a week. ONE-A-DAY (IN THE MORNING)    . pantoprazole (PROTONIX) 40 MG tablet Take 40 mg by mouth every morning.      No current facility-administered medications for this visit.    Past Medical History:  Diagnosis Date  . Arthritis   . Cataracts, bilateral   . Complication of anesthesia   . GERD (gastroesophageal reflux disease)    occ  . GERD (gastroesophageal reflux disease) 05/24/2016  . Glaucoma   . High cholesterol 05/24/2016  . Hyperlipidemia   . Leaky heart valve   . Melanoma ([place])    Left Hip 2008  . Mental disorder   . PONV (postoperative nausea and  vomiting)     Past Surgical History:  Procedure Laterality Date  . BACK SURGERY     lower back  . BIOPSY  03/28/2019   Procedure: BIOPSY;  Surgeon: Rogene Houston, MD;  [facility];  Service: Endoscopy;;  . CATARACT EXTRACTION W/PHACO Right 11/26/2013   Procedure: PHACO EMULSION CATARACT EXTRACTION WITH INTROCULAR LENS IMPLANTS RIGHT EYE;  Surgeon: [name], MD;  Location: [place];  Service: Ophthalmology;  Laterality: Right;  . CHOLECYSTECTOMY    . COLONOSCOPY N/A 05/31/2017   Procedure: COLONOSCOPY;  Surgeon: Rogene Houston, MD;  [facility];  Service: Endoscopy;  Laterality: N/A;  730  . ESOPHAGOGASTRODUODENOSCOPY (EGD) WITH PROPOFOL N/A 03/28/2019   Procedure: ESOPHAGOGASTRODUODENOSCOPY (EGD) WITH PROPOFOL;  Surgeon: Rogene Houston, MD;  [facility];  Service: Endoscopy;  Laterality: N/A;  2:55 - ok per Threasa Beards  . TONSILLECTOMY    . TRABECULECTOMY Right 09/11/2012   Procedure: TRABECULECTOMY WITH MITOMYCIN RIGHT EYE WITH GLAUCOMA DEVICE;  Surgeon: [name], MD;  Location: [place];  Service: Ophthalmology;  Laterality: Right;  . TUBAL LIGATION      Social History   Socioeconomic History  . Marital status: Single    Spouse name: Not on file  . Number of children: Not on file  . Years of education: Not on file  . Highest education level: Not on file  Occupational History  . Not on file  Tobacco Use  . Smoking status: Former Smoker  Packs/day: 1.00    Years: 30.00    Pack years: 30.00    Types: Cigarettes    Quit date: 07/17/2000    Years since quitting: 19.0  . Smokeless tobacco: Never Used  Substance and Sexual Activity  . Alcohol use: No  . Drug use: No  . Sexual activity: Not on file  Other Topics Concern  . Not on file  Social History Narrative  . Not on file   Social Determinants of Health   Financial Resource Strain:   . Difficulty of Paying Living Expenses: Not on file  Food Insecurity:   . Worried About Paediatric nurse in the Last Year: Not on file  . Ran Out of Food in the Last Year: Not on file  Transportation Needs:   . Lack of Transportation (Medical): Not on file  . Lack of Transportation (Non-Medical): Not on file  Physical Activity:   . Days of Exercise per Week: Not on file  . Minutes of Exercise per Session: Not on file  Stress:   . Feeling of Stress : Not on file  Social Connections:   . Frequency of Communication with Friends and Family: Not on file  . Frequency of Social Gatherings with Friends and Family: Not on file  . Attends Religious Services: Not on file  . Active Member of Clubs or Organizations: Not on file  . Attends Archivist Meetings: Not on file  . Marital Status: Not on file  Intimate Partner Violence:   . Fear of Current or Ex-Partner: Not on file  . Emotionally Abused: Not on file  . Physically Abused: Not on file  . Sexually Abused: Not on file     Vitals:   07/28/19 1410  BP: 122/80  Pulse: 78  SpO2: 96%  Weight: 154 lb (69.9 kg)  Height: 5\' 3"  (1.6 m)    Wt Readings from Last 3 Encounters:  07/28/19 154 lb (69.9 kg)  06/03/19 150 lb 6.4 oz (68.2 kg)  04/21/19 146 lb (66.2 kg)     PHYSICAL EXAM General: NAD HEENT: Normal. Neck: No JVD, no thyromegaly. Lungs: Clear to auscultation bilaterally with normal respiratory effort. CV: Regular rate and rhythm, normal S1/S2, no S3/S4, no murmur. No pretibial or periankle edema.  No carotid bruit.   Abdomen: Soft, nontender, no distention.  Neurologic: Alert and oriented.  Psych: Normal affect. Skin: Normal. Musculoskeletal: No gross deformities.      Labs: Lab Results  Component Value Date/Time   K 4.3 11/26/2013 08:13 AM   BUN 17 11/26/2013 08:13 AM   CREATININE 0.80 04/14/2019 02:41 PM   HGB 13.4 11/26/2013 08:13 AM     Lipids: No results found for: LDLCALC, LDLDIRECT, CHOL, TRIG, HDL     ASSESSMENT AND PLAN:  1.  Palpitations: Echocardiogram and event monitor  reviewed above.  There were no significant arrhythmias.  LV systolic function is normal.  Symptomatically stable.  Currently on a beta-blocker.  No further cardiac testing is indicated at this time.  2.  Hypertension: Controlled on Toprol-XL 25 mg daily.  No changes.  She has questions about coming off of this.  It was prescribed by her PCP for hypertension.  I recommended she speak with him about this.  3.  GERD: Currently on Protonix 40 mg daily.   Disposition: Follow up prn   Kate Sable, M.D., F.A.C.C.

## 2019-07-28 NOTE — Patient Instructions (Addendum)
Medication Instructions:   Your physician recommends that you continue on your current medications as directed. Please refer to the Current Medication list given to you today.  Labwork:  NONE  Testing/Procedures:  NONE  Follow-Up:  Your physician recommends that you schedule a follow-up appointment in: as needed.   Any Other Special Instructions Will Be Listed Below (If Applicable).  If you need a refill on your cardiac medications before your next appointment, please call your pharmacy. 

## 2019-08-04 ENCOUNTER — Ambulatory Visit: Payer: Medicare Other | Admitting: Cardiovascular Disease

## 2019-08-21 DIAGNOSIS — Z23 Encounter for immunization: Secondary | ICD-10-CM | POA: Diagnosis not present

## 2019-09-02 DIAGNOSIS — G43B0 Ophthalmoplegic migraine, not intractable: Secondary | ICD-10-CM | POA: Diagnosis not present

## 2019-09-02 DIAGNOSIS — Z961 Presence of intraocular lens: Secondary | ICD-10-CM | POA: Diagnosis not present

## 2019-09-02 DIAGNOSIS — H2512 Age-related nuclear cataract, left eye: Secondary | ICD-10-CM | POA: Diagnosis not present

## 2019-09-02 DIAGNOSIS — H401432 Capsular glaucoma with pseudoexfoliation of lens, bilateral, moderate stage: Secondary | ICD-10-CM | POA: Diagnosis not present

## 2019-09-05 DIAGNOSIS — Z1231 Encounter for screening mammogram for malignant neoplasm of breast: Secondary | ICD-10-CM | POA: Diagnosis not present

## 2019-09-19 DIAGNOSIS — Z23 Encounter for immunization: Secondary | ICD-10-CM | POA: Diagnosis not present

## 2019-09-22 DIAGNOSIS — E785 Hyperlipidemia, unspecified: Secondary | ICD-10-CM | POA: Diagnosis not present

## 2019-09-22 DIAGNOSIS — M549 Dorsalgia, unspecified: Secondary | ICD-10-CM | POA: Diagnosis not present

## 2019-09-22 DIAGNOSIS — M546 Pain in thoracic spine: Secondary | ICD-10-CM | POA: Diagnosis not present

## 2019-09-22 DIAGNOSIS — E041 Nontoxic single thyroid nodule: Secondary | ICD-10-CM | POA: Diagnosis not present

## 2019-09-22 DIAGNOSIS — I10 Essential (primary) hypertension: Secondary | ICD-10-CM | POA: Diagnosis not present

## 2019-09-22 DIAGNOSIS — I700 Atherosclerosis of aorta: Secondary | ICD-10-CM | POA: Diagnosis not present

## 2019-09-22 DIAGNOSIS — Z299 Encounter for prophylactic measures, unspecified: Secondary | ICD-10-CM | POA: Diagnosis not present

## 2019-09-22 DIAGNOSIS — M47814 Spondylosis without myelopathy or radiculopathy, thoracic region: Secondary | ICD-10-CM | POA: Diagnosis not present

## 2019-10-13 DIAGNOSIS — E041 Nontoxic single thyroid nodule: Secondary | ICD-10-CM | POA: Diagnosis not present

## 2019-10-13 DIAGNOSIS — E042 Nontoxic multinodular goiter: Secondary | ICD-10-CM | POA: Diagnosis not present

## 2020-01-01 DIAGNOSIS — Z961 Presence of intraocular lens: Secondary | ICD-10-CM | POA: Diagnosis not present

## 2020-01-01 DIAGNOSIS — H43811 Vitreous degeneration, right eye: Secondary | ICD-10-CM | POA: Diagnosis not present

## 2020-01-01 DIAGNOSIS — H401432 Capsular glaucoma with pseudoexfoliation of lens, bilateral, moderate stage: Secondary | ICD-10-CM | POA: Diagnosis not present

## 2020-01-01 DIAGNOSIS — H2512 Age-related nuclear cataract, left eye: Secondary | ICD-10-CM | POA: Diagnosis not present

## 2020-01-01 DIAGNOSIS — G43B0 Ophthalmoplegic migraine, not intractable: Secondary | ICD-10-CM | POA: Diagnosis not present

## 2020-01-01 DIAGNOSIS — G453 Amaurosis fugax: Secondary | ICD-10-CM | POA: Diagnosis not present

## 2020-03-11 DIAGNOSIS — M7742 Metatarsalgia, left foot: Secondary | ICD-10-CM | POA: Diagnosis not present

## 2020-03-11 DIAGNOSIS — M79672 Pain in left foot: Secondary | ICD-10-CM | POA: Diagnosis not present

## 2020-03-16 DIAGNOSIS — H401134 Primary open-angle glaucoma, bilateral, indeterminate stage: Secondary | ICD-10-CM | POA: Diagnosis not present

## 2020-03-17 DIAGNOSIS — Z1331 Encounter for screening for depression: Secondary | ICD-10-CM | POA: Diagnosis not present

## 2020-03-17 DIAGNOSIS — Z79899 Other long term (current) drug therapy: Secondary | ICD-10-CM | POA: Diagnosis not present

## 2020-03-17 DIAGNOSIS — R5383 Other fatigue: Secondary | ICD-10-CM | POA: Diagnosis not present

## 2020-03-17 DIAGNOSIS — C439 Malignant melanoma of skin, unspecified: Secondary | ICD-10-CM | POA: Diagnosis not present

## 2020-03-17 DIAGNOSIS — Z7189 Other specified counseling: Secondary | ICD-10-CM | POA: Diagnosis not present

## 2020-03-17 DIAGNOSIS — I10 Essential (primary) hypertension: Secondary | ICD-10-CM | POA: Diagnosis not present

## 2020-03-17 DIAGNOSIS — E7800 Pure hypercholesterolemia, unspecified: Secondary | ICD-10-CM | POA: Diagnosis not present

## 2020-03-17 DIAGNOSIS — Z0000 Encounter for general adult medical examination without abnormal findings: Secondary | ICD-10-CM | POA: Diagnosis not present

## 2020-03-17 DIAGNOSIS — Z299 Encounter for prophylactic measures, unspecified: Secondary | ICD-10-CM | POA: Diagnosis not present

## 2020-03-17 DIAGNOSIS — Z1339 Encounter for screening examination for other mental health and behavioral disorders: Secondary | ICD-10-CM | POA: Diagnosis not present

## 2020-04-05 DIAGNOSIS — E042 Nontoxic multinodular goiter: Secondary | ICD-10-CM | POA: Diagnosis not present

## 2020-04-05 DIAGNOSIS — E041 Nontoxic single thyroid nodule: Secondary | ICD-10-CM | POA: Diagnosis not present

## 2020-05-06 DIAGNOSIS — H401432 Capsular glaucoma with pseudoexfoliation of lens, bilateral, moderate stage: Secondary | ICD-10-CM | POA: Diagnosis not present

## 2020-05-06 DIAGNOSIS — G453 Amaurosis fugax: Secondary | ICD-10-CM | POA: Diagnosis not present

## 2020-05-06 DIAGNOSIS — Z961 Presence of intraocular lens: Secondary | ICD-10-CM | POA: Diagnosis not present

## 2020-05-06 DIAGNOSIS — H2512 Age-related nuclear cataract, left eye: Secondary | ICD-10-CM | POA: Diagnosis not present

## 2020-05-06 DIAGNOSIS — H43811 Vitreous degeneration, right eye: Secondary | ICD-10-CM | POA: Diagnosis not present

## 2020-05-06 DIAGNOSIS — G43B0 Ophthalmoplegic migraine, not intractable: Secondary | ICD-10-CM | POA: Diagnosis not present

## 2020-05-13 DIAGNOSIS — Z23 Encounter for immunization: Secondary | ICD-10-CM | POA: Diagnosis not present

## 2020-05-18 DIAGNOSIS — Z20822 Contact with and (suspected) exposure to covid-19: Secondary | ICD-10-CM | POA: Diagnosis not present

## 2020-06-08 DIAGNOSIS — Z23 Encounter for immunization: Secondary | ICD-10-CM | POA: Diagnosis not present

## 2020-06-24 DIAGNOSIS — J300 Vasomotor rhinitis: Secondary | ICD-10-CM | POA: Diagnosis not present

## 2020-06-24 DIAGNOSIS — H9311 Tinnitus, right ear: Secondary | ICD-10-CM | POA: Diagnosis not present

## 2020-06-24 DIAGNOSIS — H903 Sensorineural hearing loss, bilateral: Secondary | ICD-10-CM | POA: Diagnosis not present

## 2020-06-24 DIAGNOSIS — H6122 Impacted cerumen, left ear: Secondary | ICD-10-CM | POA: Diagnosis not present

## 2020-08-12 DIAGNOSIS — H401432 Capsular glaucoma with pseudoexfoliation of lens, bilateral, moderate stage: Secondary | ICD-10-CM | POA: Diagnosis not present

## 2020-08-12 DIAGNOSIS — G453 Amaurosis fugax: Secondary | ICD-10-CM | POA: Diagnosis not present

## 2020-08-12 DIAGNOSIS — H43811 Vitreous degeneration, right eye: Secondary | ICD-10-CM | POA: Diagnosis not present

## 2020-08-12 DIAGNOSIS — H04123 Dry eye syndrome of bilateral lacrimal glands: Secondary | ICD-10-CM | POA: Diagnosis not present

## 2020-08-17 IMAGING — CT CT CHEST W/ CM
2 of 3 series · 15 of 36 positions shown, 18 images · IV contrast (omnipaque)
Comparison: CT angiography of the neck and CT chest abdomen pelvis
03/13/2019.

CLINICAL DATA: Pleural effusion, mediastinal adenopathy.

EXAM:
CT CHEST WITH CONTRAST
TECHNIQUE: Multidetector CT imaging of the chest was performed during
intravenous contrast administration.
CONTRAST:  75mL OMNIPAQUE IOHEXOL 300 MG/ML  SOLN

[Series 2: routine chest with · axial · 0.61mm/px · z∈[-162,+82]mm · 12 of 144 slices shown, 15 images]
[im 11/144  mediastinal]
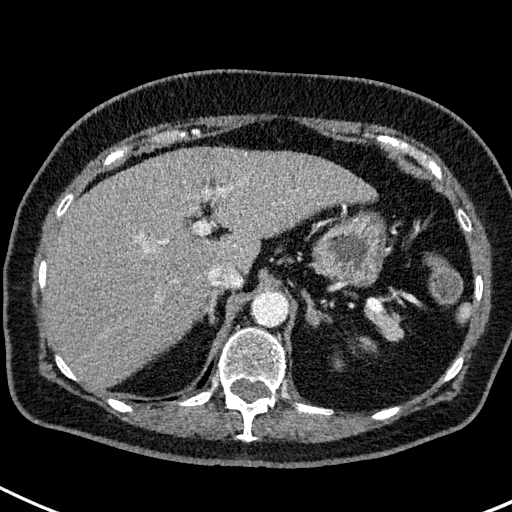
[im 11/144  lung]
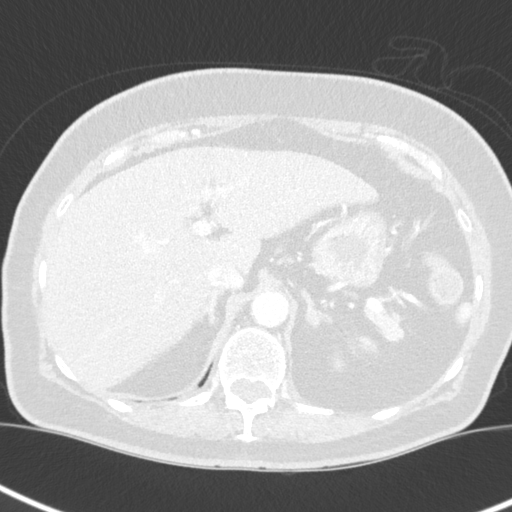
[im 22/144  lung]
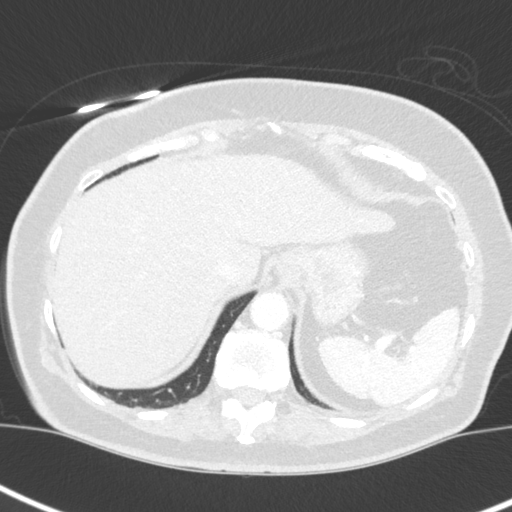
[im 32/144  lung]
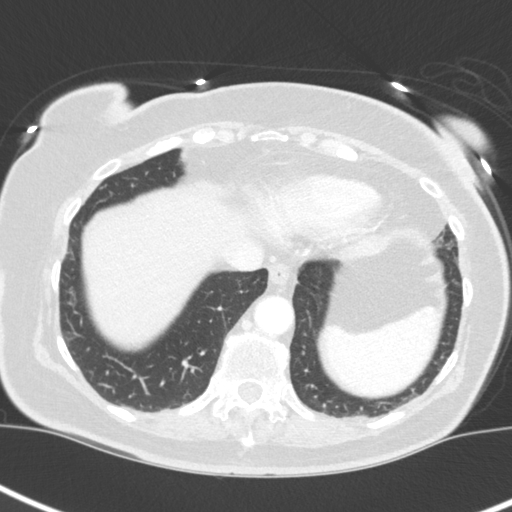
[im 43/144  lung]
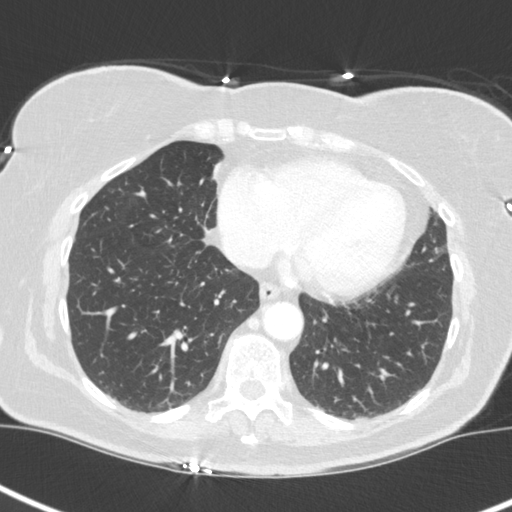
[im 53/144  mediastinal]
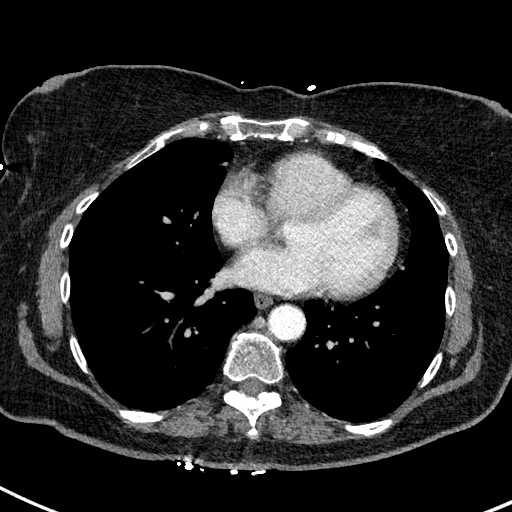
[im 53/144  lung]
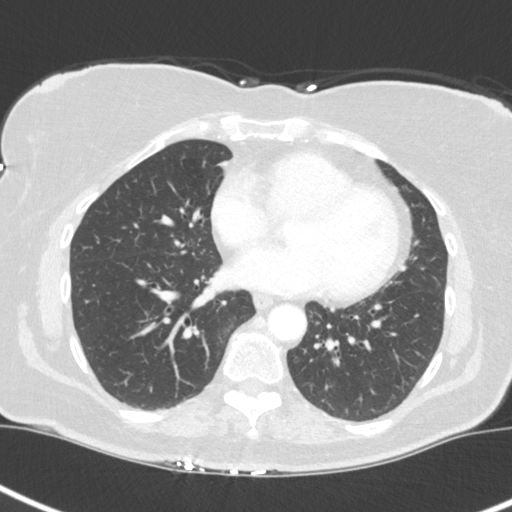
[im 64/144  lung]
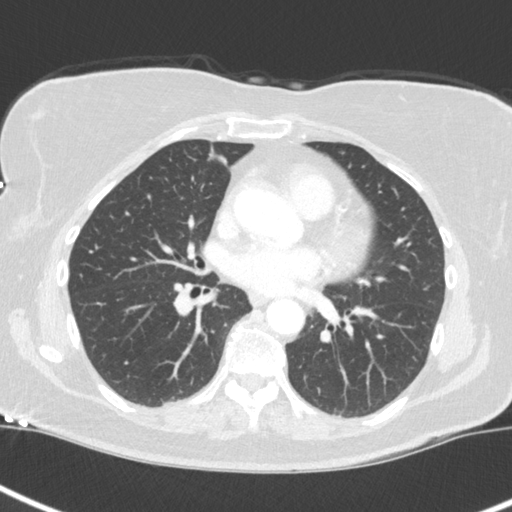
[im 80/144  lung]
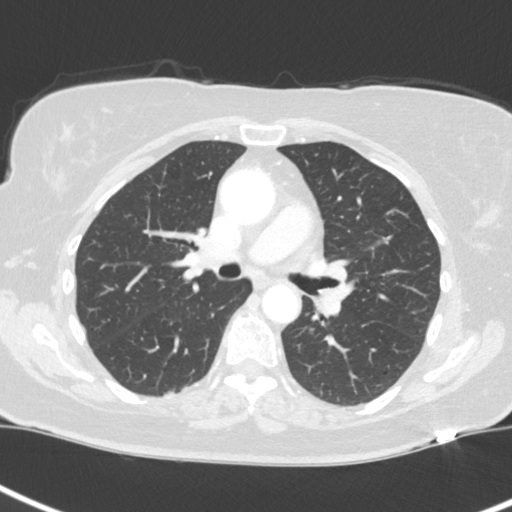
[im 91/144  lung]
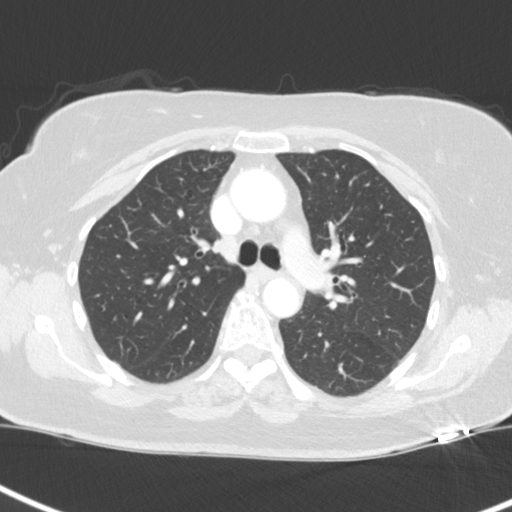
[im 101/144  mediastinal]
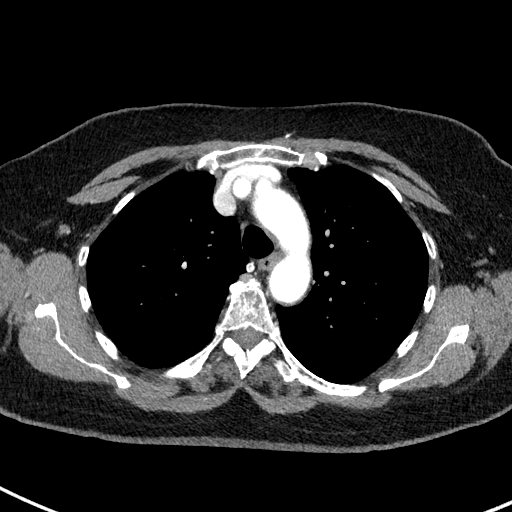
[im 101/144  lung]
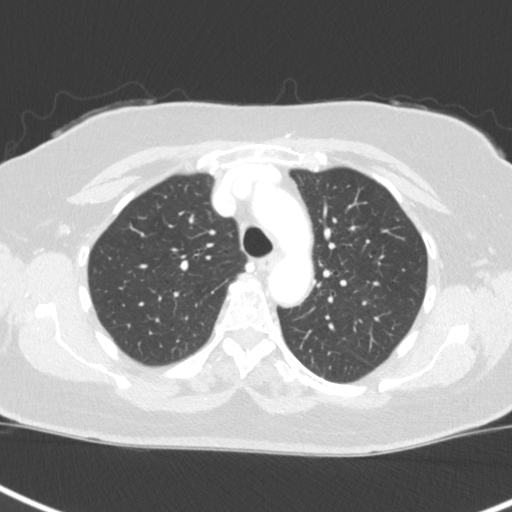
[im 112/144  lung]
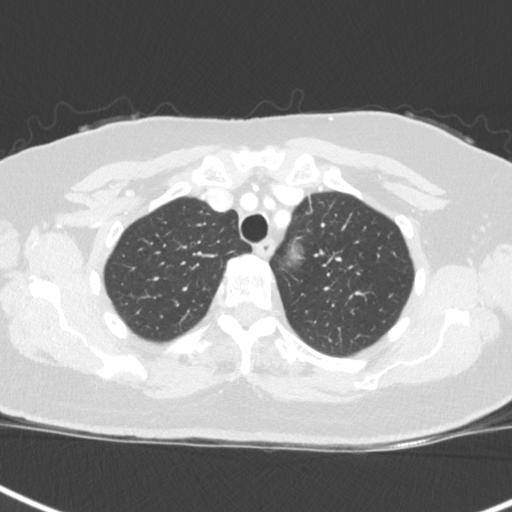
[im 122/144  lung]
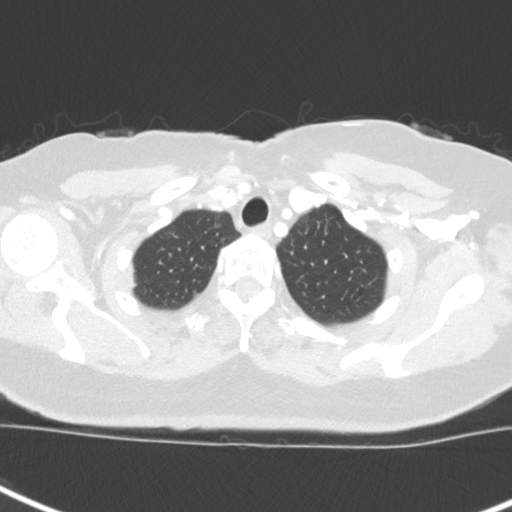
[im 133/144  lung]
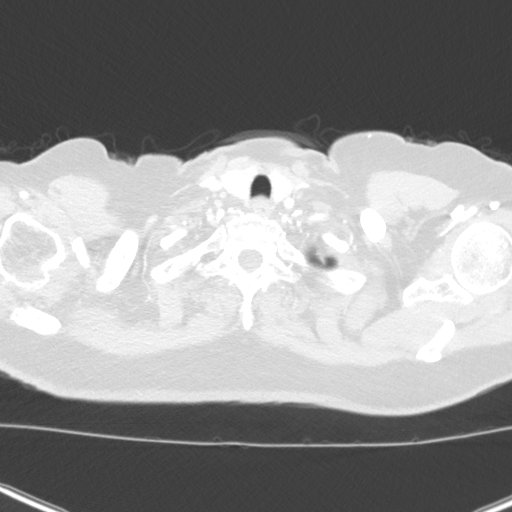

[Series 5: coronal · coronal · 0.57mm/px · 3 of 134 slices shown]
[im 27/134  lung]
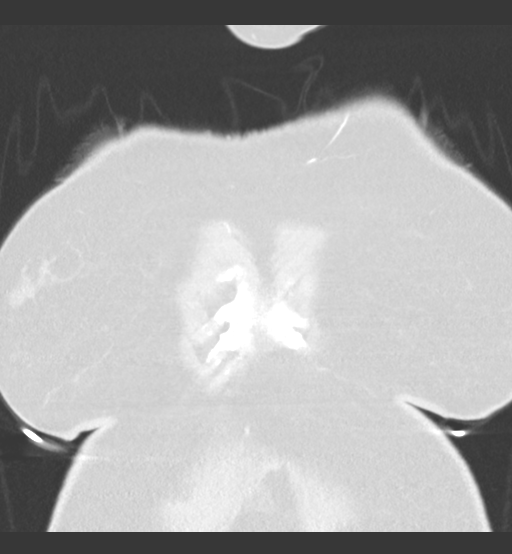
[im 54/134  lung]
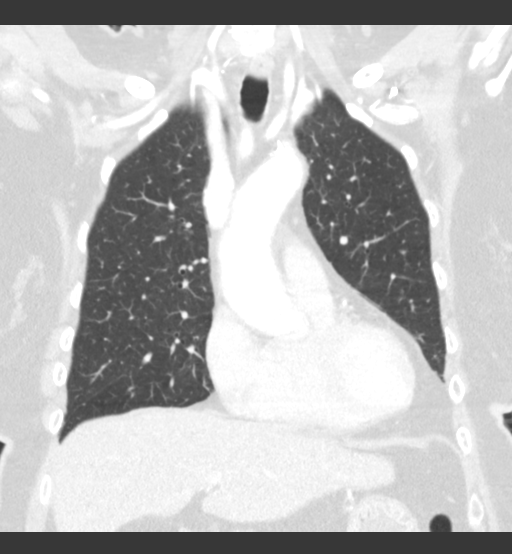
[im 80/134  lung]
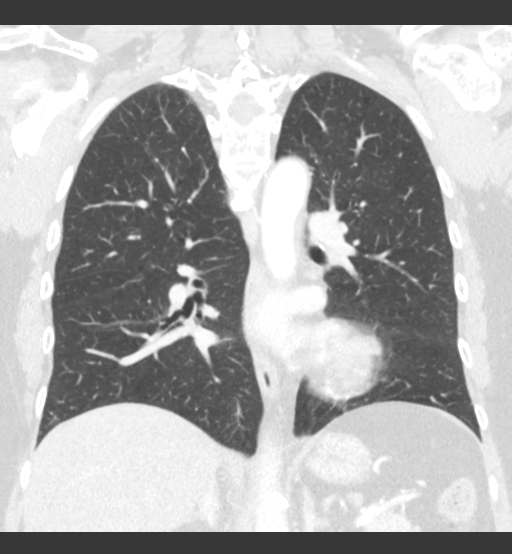

[15 of 36 positions shown; findings below may reference images not displayed]

FINDINGS: Cardiovascular: Atherosclerotic calcification of the aorta and
coronary arteries. Heart size normal. No pericardial effusion.

Mediastinum/Nodes: Heterogeneous right thyroid nodule measures
cm, as before. No pathologically enlarged mediastinal, hilar or
axillary lymph nodes. Esophagus is unremarkable.

Lungs/Pleura: Minimal scarring in the medial aspect of the right
middle lobe. Lungs are otherwise clear. No pleural fluid. Airway is
unremarkable.

Upper Abdomen: Visualized portion of the liver is unremarkable.
Cholecystectomy. Right adrenal gland is unremarkable. Slight nodular
thickening of the body of the left adrenal gland. Kidneys, spleen,
pancreas, stomach and bowel are grossly unremarkable. Periportal
lymph nodes measure up to 11 mm, as before.

Musculoskeletal: Degenerative changes in the spine. No worrisome
lytic or sclerotic lesions.
IMPRESSION: 1. No acute findings.
2. Heterogeneous right thyroid nodule. Consider further evaluation
with thyroid ultrasound. If patient is clinically hyperthyroid,
consider nuclear medicine thyroid uptake and scan.
3. Aortic atherosclerosis ([id number]). Coronary artery
calcification.

## 2020-09-09 DIAGNOSIS — R350 Frequency of micturition: Secondary | ICD-10-CM | POA: Diagnosis not present

## 2020-10-19 ENCOUNTER — Ambulatory Visit: Payer: Medicare Other | Admitting: Dermatology

## 2020-10-25 ENCOUNTER — Other Ambulatory Visit: Payer: Self-pay

## 2020-10-25 ENCOUNTER — Ambulatory Visit: Payer: Medicare Other | Admitting: Dermatology

## 2020-10-25 ENCOUNTER — Encounter: Payer: Self-pay | Admitting: Dermatology

## 2020-10-25 DIAGNOSIS — B079 Viral wart, unspecified: Secondary | ICD-10-CM

## 2020-10-25 DIAGNOSIS — L570 Actinic keratosis: Secondary | ICD-10-CM | POA: Diagnosis not present

## 2020-10-25 DIAGNOSIS — D1801 Hemangioma of skin and subcutaneous tissue: Secondary | ICD-10-CM

## 2020-10-25 DIAGNOSIS — L820 Inflamed seborrheic keratosis: Secondary | ICD-10-CM

## 2020-10-25 DIAGNOSIS — Z1283 Encounter for screening for malignant neoplasm of skin: Secondary | ICD-10-CM | POA: Diagnosis not present

## 2020-10-25 DIAGNOSIS — L821 Other seborrheic keratosis: Secondary | ICD-10-CM | POA: Diagnosis not present

## 2020-11-05 ENCOUNTER — Encounter: Payer: Self-pay | Admitting: Dermatology

## 2020-11-05 NOTE — Progress Notes (Signed)
   Follow-Up Visit   Subjective  Angelica Rhodes is a 70 y.o. female who presents for the following: New Patient (Initial Visit) (Patient here today for yearly skin check. Per patient check lesion on right ring finger that's non healing x years. Per patient the lesion is sore no bleeding, patient also has a lesion on the left clavicle that she would like checked x years per patient the lesion does get irritated no bleeding.).  General skin check Location: Crust on hand has grown Duration:  Quality:  Associated Signs/Symptoms: Modifying Factors:  Severity:  Timing: Context:   Objective  Well appearing patient in no apparent distress; mood and affect are within normal limits. Objective  Scalp: Full body skin check. No atypical moles or melanoma.   Objective  Right Dorsal Mid 4th Finger: 3 mm verrucous pink papule  Objective  Left Lower Back, Mid Back: Chest, back, left lower back: does not 3 to 8 mm flattopped brown textured papules  Objective  Left Upper Back: Red 1 to 2 mm smooth papules papules.   Objective  Left Collar bone: Slightly inflamed pink textured 6 mm papule  Objective  Right 4th Finger crease: 1 cm thick white crust on pink base, hypertrophic AK versus CIS.    A full examination was performed including scalp, head, eyes, ears, nose, lips, neck, chest, axillae, abdomen, back, buttocks, bilateral upper extremities, bilateral lower extremities, hands, feet, fingers, toes, fingernails, and toenails. All findings within normal limits unless otherwise noted below.  Areas beneath undergarments not fully examined.   Assessment & Plan    Screening exam for skin cancer Scalp  Yearly skin check, encouraged to self examine twice annually.  Viral warts, unspecified type Right Dorsal Mid 4th Finger  No intervention initiated.  Given the option of trying home freezing.  Seborrheic keratosis (2) Left Lower Back; Mid Back  Leave if stable.  Cherry  angioma Left Upper Back  No intervention necessary  Inflamed seborrheic keratosis Left Collar bone  Patient told that this sometimes resolves on its own but if it grows or bleeds she will return.  AK (actinic keratosis) Right 4th Finger crease  5second LN2 freeze.  Return in 1 to 2 months for biopsy if this fails.  Destruction of lesion - Right 4th Finger crease Complexity: simple   Destruction method: cryotherapy   Informed consent: discussed and consent obtained   Timeout:  patient name, date of birth, surgical site, and procedure verified Lesion destroyed using liquid nitrogen: Yes   Cryotherapy cycles:  5 Outcome: patient tolerated procedure well with no complications   Post-procedure details: wound care instructions given        I, Lavonna Monarch, MD, have reviewed all documentation for this visit.  The documentation on 11/05/20 for the exam, diagnosis, procedures, and orders are all accurate and complete.

## 2020-12-24 DIAGNOSIS — H6981 Other specified disorders of Eustachian tube, right ear: Secondary | ICD-10-CM | POA: Diagnosis not present

## 2020-12-24 DIAGNOSIS — Z299 Encounter for prophylactic measures, unspecified: Secondary | ICD-10-CM | POA: Diagnosis not present

## 2020-12-24 DIAGNOSIS — H9319 Tinnitus, unspecified ear: Secondary | ICD-10-CM | POA: Diagnosis not present

## 2020-12-24 DIAGNOSIS — C439 Malignant melanoma of skin, unspecified: Secondary | ICD-10-CM | POA: Diagnosis not present

## 2020-12-24 DIAGNOSIS — I10 Essential (primary) hypertension: Secondary | ICD-10-CM | POA: Diagnosis not present

## 2020-12-24 DIAGNOSIS — M542 Cervicalgia: Secondary | ICD-10-CM | POA: Diagnosis not present

## 2020-12-28 ENCOUNTER — Ambulatory Visit: Payer: Medicare Other | Admitting: Dermatology

## 2021-01-20 DIAGNOSIS — I10 Essential (primary) hypertension: Secondary | ICD-10-CM | POA: Diagnosis not present

## 2021-01-20 DIAGNOSIS — I700 Atherosclerosis of aorta: Secondary | ICD-10-CM | POA: Diagnosis not present

## 2021-01-20 DIAGNOSIS — Z299 Encounter for prophylactic measures, unspecified: Secondary | ICD-10-CM | POA: Diagnosis not present

## 2021-01-20 DIAGNOSIS — Z87891 Personal history of nicotine dependence: Secondary | ICD-10-CM | POA: Diagnosis not present

## 2021-01-20 DIAGNOSIS — U071 COVID-19: Secondary | ICD-10-CM | POA: Diagnosis not present

## 2021-02-10 DIAGNOSIS — H2512 Age-related nuclear cataract, left eye: Secondary | ICD-10-CM | POA: Diagnosis not present

## 2021-02-10 DIAGNOSIS — H43813 Vitreous degeneration, bilateral: Secondary | ICD-10-CM | POA: Diagnosis not present

## 2021-02-10 DIAGNOSIS — H401430 Capsular glaucoma with pseudoexfoliation of lens, bilateral, stage unspecified: Secondary | ICD-10-CM | POA: Diagnosis not present

## 2021-02-10 DIAGNOSIS — G453 Amaurosis fugax: Secondary | ICD-10-CM | POA: Diagnosis not present

## 2021-03-04 DIAGNOSIS — E7800 Pure hypercholesterolemia, unspecified: Secondary | ICD-10-CM | POA: Diagnosis not present

## 2021-03-04 DIAGNOSIS — Z299 Encounter for prophylactic measures, unspecified: Secondary | ICD-10-CM | POA: Diagnosis not present

## 2021-03-04 DIAGNOSIS — Z79899 Other long term (current) drug therapy: Secondary | ICD-10-CM | POA: Diagnosis not present

## 2021-03-04 DIAGNOSIS — Z87891 Personal history of nicotine dependence: Secondary | ICD-10-CM | POA: Diagnosis not present

## 2021-03-04 DIAGNOSIS — I10 Essential (primary) hypertension: Secondary | ICD-10-CM | POA: Diagnosis not present

## 2021-03-04 DIAGNOSIS — Z7189 Other specified counseling: Secondary | ICD-10-CM | POA: Diagnosis not present

## 2021-03-04 DIAGNOSIS — Z0000 Encounter for general adult medical examination without abnormal findings: Secondary | ICD-10-CM | POA: Diagnosis not present

## 2021-03-04 DIAGNOSIS — R5383 Other fatigue: Secondary | ICD-10-CM | POA: Diagnosis not present

## 2021-04-15 DIAGNOSIS — E041 Nontoxic single thyroid nodule: Secondary | ICD-10-CM | POA: Diagnosis not present

## 2021-04-15 DIAGNOSIS — E042 Nontoxic multinodular goiter: Secondary | ICD-10-CM | POA: Diagnosis not present

## 2021-05-27 DIAGNOSIS — H401430 Capsular glaucoma with pseudoexfoliation of lens, bilateral, stage unspecified: Secondary | ICD-10-CM | POA: Diagnosis not present

## 2021-06-16 DIAGNOSIS — H2512 Age-related nuclear cataract, left eye: Secondary | ICD-10-CM | POA: Diagnosis not present

## 2021-06-16 DIAGNOSIS — G453 Amaurosis fugax: Secondary | ICD-10-CM | POA: Diagnosis not present

## 2021-06-16 DIAGNOSIS — H43813 Vitreous degeneration, bilateral: Secondary | ICD-10-CM | POA: Diagnosis not present

## 2021-06-16 DIAGNOSIS — H401430 Capsular glaucoma with pseudoexfoliation of lens, bilateral, stage unspecified: Secondary | ICD-10-CM | POA: Diagnosis not present

## 2021-06-21 DIAGNOSIS — Z1231 Encounter for screening mammogram for malignant neoplasm of breast: Secondary | ICD-10-CM | POA: Diagnosis not present

## 2021-07-21 DIAGNOSIS — H2512 Age-related nuclear cataract, left eye: Secondary | ICD-10-CM | POA: Diagnosis not present

## 2021-07-21 DIAGNOSIS — H43813 Vitreous degeneration, bilateral: Secondary | ICD-10-CM | POA: Diagnosis not present

## 2021-07-21 DIAGNOSIS — G453 Amaurosis fugax: Secondary | ICD-10-CM | POA: Diagnosis not present

## 2021-07-21 DIAGNOSIS — H401430 Capsular glaucoma with pseudoexfoliation of lens, bilateral, stage unspecified: Secondary | ICD-10-CM | POA: Diagnosis not present

## 2021-10-17 DIAGNOSIS — R42 Dizziness and giddiness: Secondary | ICD-10-CM | POA: Diagnosis not present

## 2021-10-17 DIAGNOSIS — H9319 Tinnitus, unspecified ear: Secondary | ICD-10-CM | POA: Diagnosis not present

## 2021-10-17 DIAGNOSIS — I10 Essential (primary) hypertension: Secondary | ICD-10-CM | POA: Diagnosis not present

## 2021-10-17 DIAGNOSIS — Z299 Encounter for prophylactic measures, unspecified: Secondary | ICD-10-CM | POA: Diagnosis not present

## 2021-12-05 DIAGNOSIS — Z0000 Encounter for general adult medical examination without abnormal findings: Secondary | ICD-10-CM | POA: Diagnosis not present

## 2021-12-05 DIAGNOSIS — I10 Essential (primary) hypertension: Secondary | ICD-10-CM | POA: Diagnosis not present

## 2021-12-05 DIAGNOSIS — Z299 Encounter for prophylactic measures, unspecified: Secondary | ICD-10-CM | POA: Diagnosis not present

## 2021-12-05 DIAGNOSIS — C439 Malignant melanoma of skin, unspecified: Secondary | ICD-10-CM | POA: Diagnosis not present

## 2021-12-05 DIAGNOSIS — R0989 Other specified symptoms and signs involving the circulatory and respiratory systems: Secondary | ICD-10-CM | POA: Diagnosis not present

## 2021-12-05 DIAGNOSIS — Z7189 Other specified counseling: Secondary | ICD-10-CM | POA: Diagnosis not present

## 2021-12-05 DIAGNOSIS — Z87891 Personal history of nicotine dependence: Secondary | ICD-10-CM | POA: Diagnosis not present

## 2021-12-05 DIAGNOSIS — I700 Atherosclerosis of aorta: Secondary | ICD-10-CM | POA: Diagnosis not present

## 2021-12-06 ENCOUNTER — Other Ambulatory Visit: Payer: Self-pay | Admitting: Student

## 2021-12-06 ENCOUNTER — Other Ambulatory Visit (HOSPITAL_COMMUNITY): Payer: Self-pay | Admitting: Student

## 2021-12-06 DIAGNOSIS — R0989 Other specified symptoms and signs involving the circulatory and respiratory systems: Secondary | ICD-10-CM

## 2021-12-15 ENCOUNTER — Ambulatory Visit (HOSPITAL_COMMUNITY)
Admission: RE | Admit: 2021-12-15 | Discharge: 2021-12-15 | Disposition: A | Discharge status: Home / Self Care | Payer: Medicare Other | Source: Ambulatory Visit | Attending: Student | Admitting: Student

## 2021-12-15 DIAGNOSIS — R0989 Other specified symptoms and signs involving the circulatory and respiratory systems: Secondary | ICD-10-CM | POA: Diagnosis not present

## 2021-12-15 DIAGNOSIS — I6523 Occlusion and stenosis of bilateral carotid arteries: Secondary | ICD-10-CM | POA: Diagnosis not present

## 2022-01-05 DIAGNOSIS — R5383 Other fatigue: Secondary | ICD-10-CM | POA: Diagnosis not present

## 2022-01-05 DIAGNOSIS — I10 Essential (primary) hypertension: Secondary | ICD-10-CM | POA: Diagnosis not present

## 2022-01-05 DIAGNOSIS — Z299 Encounter for prophylactic measures, unspecified: Secondary | ICD-10-CM | POA: Diagnosis not present

## 2022-01-27 DIAGNOSIS — H9319 Tinnitus, unspecified ear: Secondary | ICD-10-CM | POA: Diagnosis not present

## 2022-01-27 DIAGNOSIS — I779 Disorder of arteries and arterioles, unspecified: Secondary | ICD-10-CM | POA: Diagnosis not present

## 2022-01-27 DIAGNOSIS — I10 Essential (primary) hypertension: Secondary | ICD-10-CM | POA: Diagnosis not present

## 2022-01-27 DIAGNOSIS — R42 Dizziness and giddiness: Secondary | ICD-10-CM | POA: Diagnosis not present

## 2022-01-27 DIAGNOSIS — Z299 Encounter for prophylactic measures, unspecified: Secondary | ICD-10-CM | POA: Diagnosis not present

## 2022-03-09 ENCOUNTER — Ambulatory Visit (HOSPITAL_COMMUNITY): Payer: Medicare Other | Attending: Internal Medicine

## 2022-03-09 DIAGNOSIS — R42 Dizziness and giddiness: Secondary | ICD-10-CM | POA: Insufficient documentation

## 2022-03-09 NOTE — Therapy (Signed)
OUTPATIENT PHYSICAL THERAPY VESTIBULAR EVALUATION     Patient Name: Angelica Rhodes MRN: [id number] DOB:[birth year], 71 y.o., female Today's Date: 03/09/2022  PCP: Glenda Chroman, MD REFERRING PROVIDER: Glenda Chroman, MD   PT End of Session - 03/09/22 1024     Visit Number 1    Number of Visits 2    Date for PT Re-Evaluation 03/23/22    Authorization Type [employer] Medicare; no ded, no co ins no VL    PT Start Time 1021    PT Stop Time 1110    PT Time Calculation (min) 49 min             Past Medical History:  Diagnosis Date   Arthritis    Cataracts, bilateral    Complication of anesthesia    GERD (gastroesophageal reflux disease)    occ   GERD (gastroesophageal reflux disease) 05/24/2016   Glaucoma    High cholesterol 05/24/2016   Hyperlipidemia    Leaky heart valve    Melanoma ([place])    Left Hip 2008   Mental disorder    PONV (postoperative nausea and vomiting)    Past Surgical History:  Procedure Laterality Date   BACK SURGERY     lower back   BIOPSY  03/28/2019   Procedure: BIOPSY;  Surgeon: Rogene Houston, MD;  [facility];  Service: Endoscopy;;   CATARACT EXTRACTION W/PHACO Right 11/26/2013   Procedure: PHACO EMULSION CATARACT EXTRACTION WITH INTROCULAR LENS IMPLANTS RIGHT EYE;  Surgeon: [name], MD;  Location: [place];  Service: Ophthalmology;  Laterality: Right;   CHOLECYSTECTOMY     COLONOSCOPY N/A 05/31/2017   Procedure: COLONOSCOPY;  Surgeon: Rogene Houston, MD;  [facility]: [facility];  Service: Endoscopy;  Laterality: N/A;  730   ESOPHAGOGASTRODUODENOSCOPY (EGD) WITH PROPOFOL N/A 03/28/2019   Procedure: ESOPHAGOGASTRODUODENOSCOPY (EGD) WITH PROPOFOL;  Surgeon: Rogene Houston, MD;  [facility];  Service: Endoscopy;  Laterality: N/A;  2:55 - ok per Melanie   TONSILLECTOMY     TRABECULECTOMY Right 09/11/2012   Procedure: TRABECULECTOMY WITH MITOMYCIN RIGHT EYE WITH GLAUCOMA DEVICE;  Surgeon: [name], MD;  Location: [place];   Service: Ophthalmology;  Laterality: Right;   TUBAL LIGATION     Patient Active Problem List   Diagnosis Date Noted   Special screening for malignant neoplasms, colon 01/18/2017   Esophageal dysphagia 05/25/2016   Gastroesophageal reflux disease without esophagitis 05/25/2016   GERD (gastroesophageal reflux disease) 05/24/2016   Dysphagia 05/24/2016   High cholesterol 05/24/2016    ONSET DATE: 2 years  REFERRING DIAG: PT eval/tx for vertigo per Jerene Bears, MD   THERAPY DIAG:  Dizziness and giddiness  Rationale for Evaluation and Treatment Rehabilitation  SUBJECTIVE:   SUBJECTIVE STATEMENT: Dizziness that starts with turning her head; sometimes can wake up and I will be dizzy.  Sometimes will last 3 days or so. Dr. Woody Seller gave her meclizine; "makes me feel like a zombie"; room is spinning.  Have been to several ENT's but they have only checked her hearing.  "Noise in my ears sometimes" Pt accompanied by: self  PERTINENT HISTORY: glaucoma Back surgery years ago 15 years or so   PAIN:  Are you having pain? No  PRECAUTIONS: None  WEIGHT BEARING RESTRICTIONS No  FALLS: Has patient fallen in last 6 months? No  LIVING ENVIRONMENT: Lives with: lives alone Lives in: House/apartment Stairs: Yes: External: 1 steps; none Has following equipment at home: Single point cane, Walker -  2 wheeled, and Grab bars  PLOF: Independent  PATIENT GOALS not to be dizzy anymore  OBJECTIVE:   DIAGNOSTIC FINDINGS: CLINICAL DATA:  Carotid bruit on physical exam   EXAM: BILATERAL CAROTID DUPLEX ULTRASOUND   TECHNIQUE: Pearline Cables scale imaging, color Doppler and duplex ultrasound were performed of bilateral carotid and vertebral arteries in the neck.   COMPARISON:  None Available.   FINDINGS: Criteria: Quantification of carotid stenosis is based on velocity parameters that correlate the residual internal carotid diameter with NASCET-based stenosis levels, using the diameter of the  distal internal carotid lumen as the denominator for stenosis measurement.   The following velocity measurements were obtained:   RIGHT ICA: 97/29 cm/sec CCA: 05/39 cm/sec   SYSTOLIC ICA/CCA RATIO:  1.0   ECA:  117 cm/sec   LEFT   ICA: 104/30 cm/sec   CCA: 767/34 cm/sec   SYSTOLIC ICA/CCA RATIO:  1.0   ECA:  153 cm/sec   RIGHT CAROTID ARTERY: Mild heterogeneous atherosclerotic plaque in the proximal internal carotid artery. By peak systolic velocity criteria, the estimated stenosis is less than 50%.   RIGHT VERTEBRAL ARTERY:  Patent with antegrade flow.   LEFT CAROTID ARTERY: Mild heterogeneous atherosclerotic plaque in the proximal internal carotid artery. By peak systolic velocity criteria, the estimated stenosis is less than 50%.   LEFT VERTEBRAL ARTERY:  Patent with antegrade flow.   IMPRESSION: 1. Mild (1-49%) stenosis proximal right internal carotid artery secondary to heterogenous atherosclerotic plaque. 2. Mild (1-49%) stenosis proximal left internal carotid artery secondary to heterogenous atherosclerotic plaque. 3. Vertebral arteries are patent with normal antegrade flow.   Signed,   Criselda Peaches, MD, RPVI   [facility]   [facility]    COGNITION: Overall cognitive status: Within functional limits for tasks assessed   SENSATION: WFL  EDEMA: no swelling noted     POSTURE: rounded shoulders and forward head   Cervical ROM:  wfl grossly throughout  Active A/PROM (deg) eval  Flexion   Extension   Right lateral flexion   Left lateral flexion   Right rotation   Left rotation   (Blank rows = not tested)  STRENGTH: wfl  LOWER EXTREMITY MMT:   MMT Right eval Left eval  Hip flexion    Hip abduction    Hip adduction    Hip internal rotation    Hip external rotation    Knee flexion    Knee extension    Ankle dorsiflexion    Ankle plantarflexion    Ankle inversion    Ankle  eversion    (Blank rows = not tested)  BED MOBILITY:  Sit to supine Complete Independence Supine to sit Complete Independence  TRANSFERS: Assistive device utilized: None  Sit to stand: Complete Independence Stand to sit: Complete Independence Chair to chair: Complete Independence  GAIT: Gait pattern: WFL Distance walked: 60 Assistive device utilized: None Level of assistance: Complete Independence Comments: wfl PATIENT SURVEYS:  FOTO 52   VESTIBULAR ASSESSMENT   GENERAL OBSERVATION: wears glasses    SYMPTOM BEHAVIOR:   Subjective history: dizziness that seems random on onset   Non-Vestibular symptoms: neck pain, tinnitus, and migraine symptoms   Type of dizziness: Spinning/Vertigo   Frequency: random   Duration: varies   Aggravating factors: No known aggravating factors   Relieving factors: no known relieving factors   Progression of symptoms: unchanged   OCULOMOTOR EXAM:   Ocular Alignment: normal   Ocular ROM: No Limitations   Spontaneous Nystagmus: absent  Gaze-Induced Nystagmus: absent   Smooth Pursuits: intact   Saccades: intact       VESTIBULAR - OCULAR REFLEX:    Slow VOR: Normal     Head-Impulse Test: HIT Right: negative HIT Left: negative      POSITIONAL TESTING: Right Dix-Hallpike: no nystagmus Left Dix-Hallpike: no nystagmus Right Roll Test: no nystagmus Left Roll Test: no nystagmus    MOTION SENSITIVITY:    Motion Sensitivity Quotient  Intensity: 0 = none, 1 = Lightheaded, 2 = Mild, 3 = Moderate, 4 = Severe, 5 = Vomiting  Intensity  1. Sitting to supine 0  2. Supine to L side   3. Supine to R side   4. Supine to sitting 0  5. L Hallpike-Dix 0  6. Up from L  0  7. R Hallpike-Dix 0  8. Up from R  0  9. Sitting, head  tipped to L knee   10. Head up from L  knee   11. Sitting, head  tipped to R knee   12. Head up from R  knee   13. Sitting head turns x5 0  14.Sitting head nods x5 0  15. In stance, 180  turn to L    16. In  stance, 180  turn to R     OTHOSTATICS: not done    VESTIBULAR TREATMENT:  Canalith Repositioning:    Gaze Adaptation:    Habituation:    Other: instruction on self epley if symptoms return; information on inner ear   HEP: Access Code: [email or web address] URL: [email or web address] Date: 03/09/2022 Prepared by: [facility]  Exercises - Self-Epley Maneuver Left Ear  - 1 x daily - 7 x weekly - 3 sets - 10 reps  Patient Education - What Is BPPV? - BPPV - BPPV - BPPV  PATIENT EDUCATION:  Education details: Patient educated on exam findings, POC, scope of PT, HEP and other causes of vertigo. Person educated: Patient Education method: Explanation, Demonstration, and Handouts Education comprehension: verbalized understanding, returned demonstration, verbal cues required, and tactile cues required    GOALS: Goals reviewed with patient? No  SHORT TERM GOALS: Target date: 03/23/2022   Patient will have no compliant of dizziness Baseline: Goal status: INITIAL  2.  Patient will verbalize understanding of possible causes of vertigo Baseline:  Goal status: INITIAL  3.  Patient will perform all bed mobility without any compliant of dizziness Baseline:  Goal status: INITIAL  4. Patient will improve FOTO score to predicted value to demonstrate improved functional mobility    Baseline: 52  Goal status: INITIAL     ASSESSMENT:  CLINICAL IMPRESSION: Patient is a 71 y.o. female who was seen today for physical therapy evaluation and treatment for vertigo. Patient presents with compliant of dizziness that seems to be random in occurrence and she does not have any vertigo, nystagmus or dizzy complaints with provocative testing today. This PT issued patient information regarding BPPV but I do not thing her vertigo is related to BPPV. Instructed patient on keeping a journal of when she has an attack and she verbalizes agreement.  Had patient make an appointment for 2 weeks  out and then we will assess again.   OBJECTIVE IMPAIRMENTS dizziness and impaired perceived functional ability.   ACTIVITY LIMITATIONS carrying, lifting, bending, sitting, standing, squatting, and sleeping  PARTICIPATION LIMITATIONS: meal prep, cleaning, laundry, driving, shopping, and community activity  PERSONAL FACTORS 1 comorbidity: HBP  are also affecting patient's functional outcome.   REHAB POTENTIAL: Good  CLINICAL  DECISION MAKING: Evolving/moderate complexity  EVALUATION COMPLEXITY: Moderate   PLAN: PT FREQUENCY: 1x/week  PT DURATION: 2 weeks PLANNED INTERVENTIONS: Therapeutic exercises, Therapeutic activity, Neuromuscular re-education, Balance training, Gait training, Patient/Family education, Joint manipulation, Joint mobilization, Stair training, Orthotic/Fit training, DME instructions, Aquatic Therapy, Dry Needling, Electrical stimulation, Spinal manipulation, Spinal mobilization, Cryotherapy, Moist heat, Compression bandaging, scar mobilization, Splintting, Taping, Traction, Ultrasound, Ionotophoresis '4mg'$ /ml Dexamethasone, and Manual therapy  PLAN FOR NEXT SESSION: Review HEP and goals. Patient had no symptoms with provocative testing at evaluation. Try side lying test; if negative issue habituation exercises.   12:52 PM, 03/09/22 Tyreke Kaeser Small Loukas Antonson MPT Standard physical therapy  [phone]

## 2022-03-10 DIAGNOSIS — E7800 Pure hypercholesterolemia, unspecified: Secondary | ICD-10-CM | POA: Diagnosis not present

## 2022-03-10 DIAGNOSIS — R5383 Other fatigue: Secondary | ICD-10-CM | POA: Diagnosis not present

## 2022-03-10 DIAGNOSIS — I10 Essential (primary) hypertension: Secondary | ICD-10-CM | POA: Diagnosis not present

## 2022-03-10 DIAGNOSIS — Z0000 Encounter for general adult medical examination without abnormal findings: Secondary | ICD-10-CM | POA: Diagnosis not present

## 2022-03-10 DIAGNOSIS — Z299 Encounter for prophylactic measures, unspecified: Secondary | ICD-10-CM | POA: Diagnosis not present

## 2022-03-10 DIAGNOSIS — Z79899 Other long term (current) drug therapy: Secondary | ICD-10-CM | POA: Diagnosis not present

## 2022-03-10 DIAGNOSIS — Z87891 Personal history of nicotine dependence: Secondary | ICD-10-CM | POA: Diagnosis not present

## 2022-03-27 ENCOUNTER — Encounter (HOSPITAL_COMMUNITY): Payer: Medicare Other

## 2022-03-27 DIAGNOSIS — R42 Dizziness and giddiness: Secondary | ICD-10-CM | POA: Diagnosis not present

## 2022-03-27 DIAGNOSIS — L821 Other seborrheic keratosis: Secondary | ICD-10-CM | POA: Diagnosis not present

## 2022-03-27 DIAGNOSIS — Z23 Encounter for immunization: Secondary | ICD-10-CM | POA: Diagnosis not present

## 2022-03-27 DIAGNOSIS — I10 Essential (primary) hypertension: Secondary | ICD-10-CM | POA: Diagnosis not present

## 2022-03-27 DIAGNOSIS — Z299 Encounter for prophylactic measures, unspecified: Secondary | ICD-10-CM | POA: Diagnosis not present

## 2022-06-01 DIAGNOSIS — H43813 Vitreous degeneration, bilateral: Secondary | ICD-10-CM | POA: Diagnosis not present

## 2022-06-01 DIAGNOSIS — H401430 Capsular glaucoma with pseudoexfoliation of lens, bilateral, stage unspecified: Secondary | ICD-10-CM | POA: Diagnosis not present

## 2022-06-01 DIAGNOSIS — G453 Amaurosis fugax: Secondary | ICD-10-CM | POA: Diagnosis not present

## 2022-06-01 DIAGNOSIS — H2512 Age-related nuclear cataract, left eye: Secondary | ICD-10-CM | POA: Diagnosis not present

## 2022-06-23 DIAGNOSIS — Z1231 Encounter for screening mammogram for malignant neoplasm of breast: Secondary | ICD-10-CM | POA: Diagnosis not present

## 2022-08-17 DIAGNOSIS — D485 Neoplasm of uncertain behavior of skin: Secondary | ICD-10-CM | POA: Diagnosis not present

## 2022-08-17 DIAGNOSIS — C44311 Basal cell carcinoma of skin of nose: Secondary | ICD-10-CM | POA: Diagnosis not present

## 2022-08-17 DIAGNOSIS — I10 Essential (primary) hypertension: Secondary | ICD-10-CM | POA: Diagnosis not present

## 2022-08-17 DIAGNOSIS — Z299 Encounter for prophylactic measures, unspecified: Secondary | ICD-10-CM | POA: Diagnosis not present

## 2022-08-17 DIAGNOSIS — C4491 Basal cell carcinoma of skin, unspecified: Secondary | ICD-10-CM | POA: Diagnosis not present

## 2022-09-08 DIAGNOSIS — Z299 Encounter for prophylactic measures, unspecified: Secondary | ICD-10-CM | POA: Diagnosis not present

## 2022-09-08 DIAGNOSIS — I10 Essential (primary) hypertension: Secondary | ICD-10-CM | POA: Diagnosis not present

## 2022-09-08 DIAGNOSIS — C44311 Basal cell carcinoma of skin of nose: Secondary | ICD-10-CM | POA: Diagnosis not present

## 2022-09-27 DIAGNOSIS — Z1283 Encounter for screening for malignant neoplasm of skin: Secondary | ICD-10-CM | POA: Diagnosis not present

## 2022-09-27 DIAGNOSIS — L570 Actinic keratosis: Secondary | ICD-10-CM | POA: Diagnosis not present

## 2022-09-27 DIAGNOSIS — Z85820 Personal history of malignant melanoma of skin: Secondary | ICD-10-CM | POA: Diagnosis not present

## 2022-11-30 DIAGNOSIS — G453 Amaurosis fugax: Secondary | ICD-10-CM | POA: Diagnosis not present

## 2022-11-30 DIAGNOSIS — H401430 Capsular glaucoma with pseudoexfoliation of lens, bilateral, stage unspecified: Secondary | ICD-10-CM | POA: Diagnosis not present

## 2022-11-30 DIAGNOSIS — H43813 Vitreous degeneration, bilateral: Secondary | ICD-10-CM | POA: Diagnosis not present

## 2022-11-30 DIAGNOSIS — H04123 Dry eye syndrome of bilateral lacrimal glands: Secondary | ICD-10-CM | POA: Diagnosis not present

## 2022-11-30 DIAGNOSIS — H2512 Age-related nuclear cataract, left eye: Secondary | ICD-10-CM | POA: Diagnosis not present

## 2022-12-14 DIAGNOSIS — Z299 Encounter for prophylactic measures, unspecified: Secondary | ICD-10-CM | POA: Diagnosis not present

## 2022-12-14 DIAGNOSIS — Z7189 Other specified counseling: Secondary | ICD-10-CM | POA: Diagnosis not present

## 2022-12-14 DIAGNOSIS — I10 Essential (primary) hypertension: Secondary | ICD-10-CM | POA: Diagnosis not present

## 2022-12-14 DIAGNOSIS — Z0000 Encounter for general adult medical examination without abnormal findings: Secondary | ICD-10-CM | POA: Diagnosis not present

## 2022-12-14 DIAGNOSIS — Z87891 Personal history of nicotine dependence: Secondary | ICD-10-CM | POA: Diagnosis not present

## 2022-12-14 DIAGNOSIS — I700 Atherosclerosis of aorta: Secondary | ICD-10-CM | POA: Diagnosis not present

## 2022-12-14 DIAGNOSIS — D692 Other nonthrombocytopenic purpura: Secondary | ICD-10-CM | POA: Diagnosis not present

## 2022-12-14 DIAGNOSIS — I779 Disorder of arteries and arterioles, unspecified: Secondary | ICD-10-CM | POA: Diagnosis not present

## 2022-12-28 DIAGNOSIS — E041 Nontoxic single thyroid nodule: Secondary | ICD-10-CM | POA: Diagnosis not present

## 2023-02-27 DIAGNOSIS — H401430 Capsular glaucoma with pseudoexfoliation of lens, bilateral, stage unspecified: Secondary | ICD-10-CM | POA: Diagnosis not present

## 2023-02-27 DIAGNOSIS — H43813 Vitreous degeneration, bilateral: Secondary | ICD-10-CM | POA: Diagnosis not present

## 2023-02-27 DIAGNOSIS — H2512 Age-related nuclear cataract, left eye: Secondary | ICD-10-CM | POA: Diagnosis not present

## 2023-02-27 DIAGNOSIS — G453 Amaurosis fugax: Secondary | ICD-10-CM | POA: Diagnosis not present

## 2023-03-12 DIAGNOSIS — Z299 Encounter for prophylactic measures, unspecified: Secondary | ICD-10-CM | POA: Diagnosis not present

## 2023-03-12 DIAGNOSIS — Z79899 Other long term (current) drug therapy: Secondary | ICD-10-CM | POA: Diagnosis not present

## 2023-03-12 DIAGNOSIS — E7800 Pure hypercholesterolemia, unspecified: Secondary | ICD-10-CM | POA: Diagnosis not present

## 2023-03-12 DIAGNOSIS — Z0000 Encounter for general adult medical examination without abnormal findings: Secondary | ICD-10-CM | POA: Diagnosis not present

## 2023-03-12 DIAGNOSIS — R5383 Other fatigue: Secondary | ICD-10-CM | POA: Diagnosis not present

## 2023-03-12 DIAGNOSIS — I10 Essential (primary) hypertension: Secondary | ICD-10-CM | POA: Diagnosis not present

## 2023-03-12 DIAGNOSIS — Z87891 Personal history of nicotine dependence: Secondary | ICD-10-CM | POA: Diagnosis not present

## 2023-03-15 DIAGNOSIS — Z299 Encounter for prophylactic measures, unspecified: Secondary | ICD-10-CM | POA: Diagnosis not present

## 2023-03-15 DIAGNOSIS — R42 Dizziness and giddiness: Secondary | ICD-10-CM | POA: Diagnosis not present

## 2023-03-15 DIAGNOSIS — D485 Neoplasm of uncertain behavior of skin: Secondary | ICD-10-CM | POA: Diagnosis not present

## 2023-03-15 DIAGNOSIS — L820 Inflamed seborrheic keratosis: Secondary | ICD-10-CM | POA: Diagnosis not present

## 2023-03-15 DIAGNOSIS — I10 Essential (primary) hypertension: Secondary | ICD-10-CM | POA: Diagnosis not present

## 2023-03-15 DIAGNOSIS — M179 Osteoarthritis of knee, unspecified: Secondary | ICD-10-CM | POA: Diagnosis not present

## 2023-05-31 DIAGNOSIS — H40021 Open angle with borderline findings, high risk, right eye: Secondary | ICD-10-CM | POA: Diagnosis not present

## 2023-05-31 DIAGNOSIS — H2512 Age-related nuclear cataract, left eye: Secondary | ICD-10-CM | POA: Diagnosis not present

## 2023-05-31 DIAGNOSIS — G453 Amaurosis fugax: Secondary | ICD-10-CM | POA: Diagnosis not present

## 2023-05-31 DIAGNOSIS — H43813 Vitreous degeneration, bilateral: Secondary | ICD-10-CM | POA: Diagnosis not present

## 2023-05-31 DIAGNOSIS — H401121 Primary open-angle glaucoma, left eye, mild stage: Secondary | ICD-10-CM | POA: Diagnosis not present

## 2023-05-31 DIAGNOSIS — H401430 Capsular glaucoma with pseudoexfoliation of lens, bilateral, stage unspecified: Secondary | ICD-10-CM | POA: Diagnosis not present

## 2023-06-06 DIAGNOSIS — Z299 Encounter for prophylactic measures, unspecified: Secondary | ICD-10-CM | POA: Diagnosis not present

## 2023-06-06 DIAGNOSIS — I10 Essential (primary) hypertension: Secondary | ICD-10-CM | POA: Diagnosis not present

## 2023-06-06 DIAGNOSIS — M79605 Pain in left leg: Secondary | ICD-10-CM | POA: Diagnosis not present

## 2023-06-06 DIAGNOSIS — Z23 Encounter for immunization: Secondary | ICD-10-CM | POA: Diagnosis not present

## 2023-07-26 DIAGNOSIS — Z1231 Encounter for screening mammogram for malignant neoplasm of breast: Secondary | ICD-10-CM | POA: Diagnosis not present

## 2023-08-14 DIAGNOSIS — L570 Actinic keratosis: Secondary | ICD-10-CM | POA: Diagnosis not present

## 2023-09-13 DIAGNOSIS — M542 Cervicalgia: Secondary | ICD-10-CM | POA: Diagnosis not present

## 2023-09-13 DIAGNOSIS — D692 Other nonthrombocytopenic purpura: Secondary | ICD-10-CM | POA: Diagnosis not present

## 2023-09-13 DIAGNOSIS — Z299 Encounter for prophylactic measures, unspecified: Secondary | ICD-10-CM | POA: Diagnosis not present

## 2023-09-13 DIAGNOSIS — I779 Disorder of arteries and arterioles, unspecified: Secondary | ICD-10-CM | POA: Diagnosis not present

## 2023-09-13 DIAGNOSIS — I700 Atherosclerosis of aorta: Secondary | ICD-10-CM | POA: Diagnosis not present

## 2023-09-13 DIAGNOSIS — I10 Essential (primary) hypertension: Secondary | ICD-10-CM | POA: Diagnosis not present

## 2023-09-20 DIAGNOSIS — H6123 Impacted cerumen, bilateral: Secondary | ICD-10-CM | POA: Diagnosis not present

## 2023-09-20 DIAGNOSIS — I10 Essential (primary) hypertension: Secondary | ICD-10-CM | POA: Diagnosis not present

## 2023-09-20 DIAGNOSIS — Z299 Encounter for prophylactic measures, unspecified: Secondary | ICD-10-CM | POA: Diagnosis not present

## 2023-10-25 DIAGNOSIS — H401430 Capsular glaucoma with pseudoexfoliation of lens, bilateral, stage unspecified: Secondary | ICD-10-CM | POA: Diagnosis not present

## 2023-10-25 DIAGNOSIS — G453 Amaurosis fugax: Secondary | ICD-10-CM | POA: Diagnosis not present

## 2023-10-25 DIAGNOSIS — H2512 Age-related nuclear cataract, left eye: Secondary | ICD-10-CM | POA: Diagnosis not present

## 2023-10-25 DIAGNOSIS — H43813 Vitreous degeneration, bilateral: Secondary | ICD-10-CM | POA: Diagnosis not present

## 2023-10-31 DIAGNOSIS — R202 Paresthesia of skin: Secondary | ICD-10-CM | POA: Diagnosis not present

## 2023-10-31 DIAGNOSIS — I10 Essential (primary) hypertension: Secondary | ICD-10-CM | POA: Diagnosis not present

## 2023-10-31 DIAGNOSIS — Z299 Encounter for prophylactic measures, unspecified: Secondary | ICD-10-CM | POA: Diagnosis not present

## 2023-10-31 DIAGNOSIS — M545 Low back pain, unspecified: Secondary | ICD-10-CM | POA: Diagnosis not present

## 2023-11-21 DIAGNOSIS — M545 Low back pain, unspecified: Secondary | ICD-10-CM | POA: Diagnosis not present

## 2023-11-21 DIAGNOSIS — I10 Essential (primary) hypertension: Secondary | ICD-10-CM | POA: Diagnosis not present

## 2023-11-21 DIAGNOSIS — Z299 Encounter for prophylactic measures, unspecified: Secondary | ICD-10-CM | POA: Diagnosis not present

## 2023-11-21 DIAGNOSIS — B370 Candidal stomatitis: Secondary | ICD-10-CM | POA: Diagnosis not present

## 2023-12-25 DIAGNOSIS — I10 Essential (primary) hypertension: Secondary | ICD-10-CM | POA: Diagnosis not present

## 2023-12-25 DIAGNOSIS — Z299 Encounter for prophylactic measures, unspecified: Secondary | ICD-10-CM | POA: Diagnosis not present

## 2023-12-25 DIAGNOSIS — Z1389 Encounter for screening for other disorder: Secondary | ICD-10-CM | POA: Diagnosis not present

## 2023-12-25 DIAGNOSIS — E785 Hyperlipidemia, unspecified: Secondary | ICD-10-CM | POA: Diagnosis not present

## 2023-12-25 DIAGNOSIS — R5383 Other fatigue: Secondary | ICD-10-CM | POA: Diagnosis not present

## 2023-12-25 DIAGNOSIS — Z7189 Other specified counseling: Secondary | ICD-10-CM | POA: Diagnosis not present

## 2023-12-25 DIAGNOSIS — M79604 Pain in right leg: Secondary | ICD-10-CM | POA: Diagnosis not present

## 2023-12-25 DIAGNOSIS — Z0000 Encounter for general adult medical examination without abnormal findings: Secondary | ICD-10-CM | POA: Diagnosis not present

## 2023-12-31 DIAGNOSIS — M79604 Pain in right leg: Secondary | ICD-10-CM | POA: Diagnosis not present

## 2023-12-31 DIAGNOSIS — M79605 Pain in left leg: Secondary | ICD-10-CM | POA: Diagnosis not present

## 2024-01-01 DIAGNOSIS — M47816 Spondylosis without myelopathy or radiculopathy, lumbar region: Secondary | ICD-10-CM | POA: Diagnosis not present

## 2024-01-01 DIAGNOSIS — M5442 Lumbago with sciatica, left side: Secondary | ICD-10-CM | POA: Diagnosis not present

## 2024-01-01 DIAGNOSIS — M5441 Lumbago with sciatica, right side: Secondary | ICD-10-CM | POA: Diagnosis not present

## 2024-01-01 DIAGNOSIS — G8929 Other chronic pain: Secondary | ICD-10-CM | POA: Diagnosis not present

## 2024-01-17 ENCOUNTER — Other Ambulatory Visit (HOSPITAL_COMMUNITY): Payer: Self-pay | Admitting: Neurosurgery

## 2024-01-17 DIAGNOSIS — G8929 Other chronic pain: Secondary | ICD-10-CM

## 2024-01-22 DIAGNOSIS — H43813 Vitreous degeneration, bilateral: Secondary | ICD-10-CM | POA: Diagnosis not present

## 2024-01-22 DIAGNOSIS — H04123 Dry eye syndrome of bilateral lacrimal glands: Secondary | ICD-10-CM | POA: Diagnosis not present

## 2024-01-22 DIAGNOSIS — H2512 Age-related nuclear cataract, left eye: Secondary | ICD-10-CM | POA: Diagnosis not present

## 2024-01-22 DIAGNOSIS — H401430 Capsular glaucoma with pseudoexfoliation of lens, bilateral, stage unspecified: Secondary | ICD-10-CM | POA: Diagnosis not present

## 2024-01-31 ENCOUNTER — Ambulatory Visit (HOSPITAL_COMMUNITY)
Admission: RE | Admit: 2024-01-31 | Discharge: 2024-01-31 | Disposition: A | Discharge status: Home / Self Care | Source: Ambulatory Visit | Attending: Neurosurgery | Admitting: Neurosurgery

## 2024-01-31 DIAGNOSIS — G8929 Other chronic pain: Secondary | ICD-10-CM | POA: Insufficient documentation

## 2024-01-31 DIAGNOSIS — M5442 Lumbago with sciatica, left side: Secondary | ICD-10-CM | POA: Insufficient documentation

## 2024-01-31 DIAGNOSIS — M545 Low back pain, unspecified: Secondary | ICD-10-CM | POA: Diagnosis not present

## 2024-01-31 DIAGNOSIS — M5441 Lumbago with sciatica, right side: Secondary | ICD-10-CM | POA: Insufficient documentation

## 2024-01-31 DIAGNOSIS — M48061 Spinal stenosis, lumbar region without neurogenic claudication: Secondary | ICD-10-CM | POA: Diagnosis not present

## 2024-01-31 DIAGNOSIS — M5116 Intervertebral disc disorders with radiculopathy, lumbar region: Secondary | ICD-10-CM | POA: Diagnosis not present

## 2024-01-31 DIAGNOSIS — M5115 Intervertebral disc disorders with radiculopathy, thoracolumbar region: Secondary | ICD-10-CM | POA: Diagnosis not present

## 2024-02-13 DIAGNOSIS — L570 Actinic keratosis: Secondary | ICD-10-CM | POA: Diagnosis not present

## 2024-02-13 DIAGNOSIS — L821 Other seborrheic keratosis: Secondary | ICD-10-CM | POA: Diagnosis not present

## 2024-02-25 DIAGNOSIS — M5442 Lumbago with sciatica, left side: Secondary | ICD-10-CM | POA: Diagnosis not present

## 2024-03-11 DIAGNOSIS — M6281 Muscle weakness (generalized): Secondary | ICD-10-CM | POA: Diagnosis not present

## 2024-03-11 DIAGNOSIS — M48062 Spinal stenosis, lumbar region with neurogenic claudication: Secondary | ICD-10-CM | POA: Diagnosis not present

## 2024-03-11 DIAGNOSIS — M545 Low back pain, unspecified: Secondary | ICD-10-CM | POA: Diagnosis not present

## 2024-03-11 DIAGNOSIS — R262 Difficulty in walking, not elsewhere classified: Secondary | ICD-10-CM | POA: Diagnosis not present

## 2024-03-18 DIAGNOSIS — M545 Low back pain, unspecified: Secondary | ICD-10-CM | POA: Diagnosis not present

## 2024-03-18 DIAGNOSIS — M6281 Muscle weakness (generalized): Secondary | ICD-10-CM | POA: Diagnosis not present

## 2024-03-18 DIAGNOSIS — R262 Difficulty in walking, not elsewhere classified: Secondary | ICD-10-CM | POA: Diagnosis not present

## 2024-03-18 DIAGNOSIS — M48062 Spinal stenosis, lumbar region with neurogenic claudication: Secondary | ICD-10-CM | POA: Diagnosis not present

## 2024-03-21 DIAGNOSIS — Z0000 Encounter for general adult medical examination without abnormal findings: Secondary | ICD-10-CM | POA: Diagnosis not present

## 2024-03-21 DIAGNOSIS — Z299 Encounter for prophylactic measures, unspecified: Secondary | ICD-10-CM | POA: Diagnosis not present

## 2024-03-21 DIAGNOSIS — Z79899 Other long term (current) drug therapy: Secondary | ICD-10-CM | POA: Diagnosis not present

## 2024-03-21 DIAGNOSIS — I10 Essential (primary) hypertension: Secondary | ICD-10-CM | POA: Diagnosis not present

## 2024-03-21 DIAGNOSIS — E785 Hyperlipidemia, unspecified: Secondary | ICD-10-CM | POA: Diagnosis not present

## 2024-03-21 DIAGNOSIS — R5383 Other fatigue: Secondary | ICD-10-CM | POA: Diagnosis not present

## 2024-03-27 DIAGNOSIS — M6281 Muscle weakness (generalized): Secondary | ICD-10-CM | POA: Diagnosis not present

## 2024-03-27 DIAGNOSIS — M545 Low back pain, unspecified: Secondary | ICD-10-CM | POA: Diagnosis not present

## 2024-03-27 DIAGNOSIS — R262 Difficulty in walking, not elsewhere classified: Secondary | ICD-10-CM | POA: Diagnosis not present

## 2024-03-27 DIAGNOSIS — M48062 Spinal stenosis, lumbar region with neurogenic claudication: Secondary | ICD-10-CM | POA: Diagnosis not present

## 2024-04-03 DIAGNOSIS — R262 Difficulty in walking, not elsewhere classified: Secondary | ICD-10-CM | POA: Diagnosis not present

## 2024-04-03 DIAGNOSIS — M545 Low back pain, unspecified: Secondary | ICD-10-CM | POA: Diagnosis not present

## 2024-04-03 DIAGNOSIS — M48062 Spinal stenosis, lumbar region with neurogenic claudication: Secondary | ICD-10-CM | POA: Diagnosis not present

## 2024-04-03 DIAGNOSIS — M6281 Muscle weakness (generalized): Secondary | ICD-10-CM | POA: Diagnosis not present

## 2024-04-10 DIAGNOSIS — M48062 Spinal stenosis, lumbar region with neurogenic claudication: Secondary | ICD-10-CM | POA: Diagnosis not present

## 2024-04-10 DIAGNOSIS — M6281 Muscle weakness (generalized): Secondary | ICD-10-CM | POA: Diagnosis not present

## 2024-04-10 DIAGNOSIS — R262 Difficulty in walking, not elsewhere classified: Secondary | ICD-10-CM | POA: Diagnosis not present

## 2024-04-10 DIAGNOSIS — M545 Low back pain, unspecified: Secondary | ICD-10-CM | POA: Diagnosis not present

## 2024-04-15 DIAGNOSIS — M6281 Muscle weakness (generalized): Secondary | ICD-10-CM | POA: Diagnosis not present

## 2024-04-15 DIAGNOSIS — M545 Low back pain, unspecified: Secondary | ICD-10-CM | POA: Diagnosis not present

## 2024-04-15 DIAGNOSIS — R262 Difficulty in walking, not elsewhere classified: Secondary | ICD-10-CM | POA: Diagnosis not present

## 2024-04-15 DIAGNOSIS — M48062 Spinal stenosis, lumbar region with neurogenic claudication: Secondary | ICD-10-CM | POA: Diagnosis not present

## 2024-05-02 ENCOUNTER — Encounter (INDEPENDENT_AMBULATORY_CARE_PROVIDER_SITE_OTHER): Payer: Self-pay | Admitting: *Deleted

## 2024-05-02 DIAGNOSIS — I10 Essential (primary) hypertension: Secondary | ICD-10-CM | POA: Diagnosis not present

## 2024-05-02 DIAGNOSIS — Z299 Encounter for prophylactic measures, unspecified: Secondary | ICD-10-CM | POA: Diagnosis not present

## 2024-05-02 DIAGNOSIS — R7989 Other specified abnormal findings of blood chemistry: Secondary | ICD-10-CM | POA: Diagnosis not present
